# Patient Record
Sex: Female | Born: 1945 | Race: White | Hispanic: No | Marital: Single | Smoking: Never smoker
Health system: Southern US, Community
[De-identification: ages and names within clinical notes are randomized; demographics above are authoritative.]

## PROBLEM LIST (undated history)

## (undated) DIAGNOSIS — J3089 Other allergic rhinitis: Secondary | ICD-10-CM

## (undated) DIAGNOSIS — R413 Other amnesia: Secondary | ICD-10-CM

## (undated) DIAGNOSIS — Z9889 Other specified postprocedural states: Secondary | ICD-10-CM

## (undated) DIAGNOSIS — E1140 Type 2 diabetes mellitus with diabetic neuropathy, unspecified: Secondary | ICD-10-CM

## (undated) DIAGNOSIS — I428 Other cardiomyopathies: Secondary | ICD-10-CM

## (undated) DIAGNOSIS — R112 Nausea with vomiting, unspecified: Secondary | ICD-10-CM

## (undated) DIAGNOSIS — I10 Essential (primary) hypertension: Secondary | ICD-10-CM

## (undated) DIAGNOSIS — E785 Hyperlipidemia, unspecified: Secondary | ICD-10-CM

## (undated) DIAGNOSIS — Z95810 Presence of automatic (implantable) cardiac defibrillator: Secondary | ICD-10-CM

## (undated) DIAGNOSIS — I469 Cardiac arrest, cause unspecified: Secondary | ICD-10-CM

## (undated) DIAGNOSIS — I2510 Atherosclerotic heart disease of native coronary artery without angina pectoris: Secondary | ICD-10-CM

## (undated) DIAGNOSIS — H6980 Other specified disorders of Eustachian tube, unspecified ear: Secondary | ICD-10-CM

## (undated) DIAGNOSIS — J029 Acute pharyngitis, unspecified: Secondary | ICD-10-CM

## (undated) DIAGNOSIS — I4891 Unspecified atrial fibrillation: Secondary | ICD-10-CM

## (undated) DIAGNOSIS — G629 Polyneuropathy, unspecified: Secondary | ICD-10-CM

## (undated) DIAGNOSIS — H6990 Unspecified Eustachian tube disorder, unspecified ear: Secondary | ICD-10-CM

## (undated) HISTORY — PX: COLONOSCOPY: SHX174

## (undated) HISTORY — DX: Other specified disorders of Eustachian tube, unspecified ear: H69.80

## (undated) HISTORY — DX: Presence of automatic (implantable) cardiac defibrillator: Z95.810

## (undated) HISTORY — PX: TONSILLECTOMY: SUR1361

## (undated) HISTORY — DX: Other cardiomyopathies: I42.8

## (undated) HISTORY — PX: KNEE SURGERY: SHX244

## (undated) HISTORY — DX: Acute pharyngitis, unspecified: J02.9

## (undated) HISTORY — DX: Essential (primary) hypertension: I10

## (undated) HISTORY — DX: Unspecified eustachian tube disorder, unspecified ear: H69.90

## (undated) HISTORY — DX: Unspecified atrial fibrillation: I48.91

## (undated) HISTORY — DX: Type 2 diabetes mellitus with diabetic neuropathy, unspecified: E11.40

## (undated) HISTORY — DX: Other allergic rhinitis: J30.89

## (undated) HISTORY — DX: Cardiac arrest, cause unspecified: I46.9

## (undated) HISTORY — DX: Polyneuropathy, unspecified: G62.9

## (undated) HISTORY — DX: Hyperlipidemia, unspecified: E78.5

## (undated) HISTORY — DX: Atherosclerotic heart disease of native coronary artery without angina pectoris: I25.10

## (undated) HISTORY — PX: CATARACT EXTRACTION: SUR2

## (undated) HISTORY — DX: Other amnesia: R41.3

---

## 2001-02-05 ENCOUNTER — Emergency Department (HOSPITAL_COMMUNITY): Admission: EM | Admit: 2001-02-05 | Discharge: 2001-02-06 | Payer: Self-pay | Admitting: *Deleted

## 2001-02-05 ENCOUNTER — Encounter: Payer: Self-pay | Admitting: Emergency Medicine

## 2001-02-14 ENCOUNTER — Ambulatory Visit (HOSPITAL_COMMUNITY): Admission: RE | Admit: 2001-02-14 | Discharge: 2001-02-14 | Payer: Self-pay | Admitting: Internal Medicine

## 2001-02-14 ENCOUNTER — Encounter: Payer: Self-pay | Admitting: Internal Medicine

## 2001-03-27 ENCOUNTER — Other Ambulatory Visit: Admission: RE | Admit: 2001-03-27 | Discharge: 2001-03-27 | Payer: Self-pay | Admitting: Obstetrics & Gynecology

## 2001-10-24 ENCOUNTER — Encounter: Payer: Self-pay | Admitting: Emergency Medicine

## 2001-10-24 ENCOUNTER — Emergency Department (HOSPITAL_COMMUNITY): Admission: EM | Admit: 2001-10-24 | Discharge: 2001-10-24 | Payer: Self-pay | Admitting: Emergency Medicine

## 2004-06-23 ENCOUNTER — Observation Stay (HOSPITAL_COMMUNITY): Admission: AD | Admit: 2004-06-23 | Discharge: 2004-06-24 | Payer: Self-pay | Admitting: Family Medicine

## 2005-04-19 ENCOUNTER — Inpatient Hospital Stay (HOSPITAL_COMMUNITY): Admission: EM | Admit: 2005-04-19 | Discharge: 2005-04-20 | Payer: Self-pay | Admitting: *Deleted

## 2005-05-03 ENCOUNTER — Ambulatory Visit (HOSPITAL_COMMUNITY): Admission: RE | Admit: 2005-05-03 | Discharge: 2005-05-03 | Payer: Self-pay | Admitting: Family Medicine

## 2005-07-31 ENCOUNTER — Ambulatory Visit (HOSPITAL_COMMUNITY): Admission: RE | Admit: 2005-07-31 | Discharge: 2005-07-31 | Payer: Self-pay | Admitting: Family Medicine

## 2005-08-30 ENCOUNTER — Ambulatory Visit (HOSPITAL_COMMUNITY): Admission: RE | Admit: 2005-08-30 | Discharge: 2005-08-30 | Payer: Self-pay | Admitting: Family Medicine

## 2006-01-04 ENCOUNTER — Ambulatory Visit (HOSPITAL_COMMUNITY): Admission: RE | Admit: 2006-01-04 | Discharge: 2006-01-04 | Payer: Self-pay | Admitting: Family Medicine

## 2008-01-17 ENCOUNTER — Ambulatory Visit (HOSPITAL_COMMUNITY): Admission: RE | Admit: 2008-01-17 | Discharge: 2008-01-17 | Payer: Self-pay | Admitting: Family Medicine

## 2009-10-24 ENCOUNTER — Emergency Department: Payer: Self-pay | Admitting: Internal Medicine

## 2009-12-21 ENCOUNTER — Ambulatory Visit: Payer: No Typology Code available for payment source

## 2010-07-24 ENCOUNTER — Ambulatory Visit: Payer: Self-pay | Admitting: Cardiovascular Disease

## 2010-07-24 ENCOUNTER — Inpatient Hospital Stay: Payer: No Typology Code available for payment source | Admitting: Internal Medicine

## 2010-07-29 ENCOUNTER — Encounter: Payer: Self-pay | Admitting: Cardiovascular Disease

## 2010-08-01 ENCOUNTER — Encounter: Payer: Self-pay | Admitting: Cardiovascular Disease

## 2010-08-01 HISTORY — PX: CARDIAC DEFIBRILLATOR PLACEMENT: SHX171

## 2010-08-02 ENCOUNTER — Encounter: Payer: Self-pay | Admitting: Cardiovascular Disease

## 2010-08-06 ENCOUNTER — Emergency Department: Payer: No Typology Code available for payment source | Admitting: Emergency Medicine

## 2010-08-07 ENCOUNTER — Emergency Department: Payer: No Typology Code available for payment source | Admitting: Emergency Medicine

## 2010-08-07 ENCOUNTER — Encounter: Payer: Self-pay | Admitting: Cardiovascular Disease

## 2010-08-09 ENCOUNTER — Encounter: Payer: Self-pay | Admitting: Cardiovascular Disease

## 2010-08-10 ENCOUNTER — Ambulatory Visit: Payer: Self-pay | Admitting: Cardiovascular Disease

## 2010-08-10 ENCOUNTER — Encounter: Payer: Self-pay | Admitting: Cardiovascular Disease

## 2010-08-10 DIAGNOSIS — E785 Hyperlipidemia, unspecified: Secondary | ICD-10-CM

## 2010-08-10 DIAGNOSIS — I469 Cardiac arrest, cause unspecified: Secondary | ICD-10-CM | POA: Insufficient documentation

## 2010-08-10 DIAGNOSIS — I10 Essential (primary) hypertension: Secondary | ICD-10-CM

## 2010-09-24 ENCOUNTER — Encounter: Payer: Self-pay | Admitting: Family Medicine

## 2010-10-04 NOTE — Assessment & Plan Note (Signed)
Summary: F/U [facility] Acute respiratory failure   Visit Type:  Initial Consult Primary Provider:  None  CC:  F/U [facility]; Defib Implant November 28 and 2011.  c/o chest pain on Sunday and was sent to [facility] and told has pneumonia..  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 She was seen by myself at [facility] during this hospital course.  She presents today with her boyfriend. He states that she continues to be weak and is slowly improving. She is recovering from her ICD placement which was performed prior to her discharge. She continues to wear the sling restricting her left arm. Her short term memory continues to have trouble. She denies any shortness of breath. She does have a little chest discomfort when she swallows. No edema. She would like to know when she can go home.   Cardiac cath on 07/29/2010 showed 40% adn 30% lesions in the mid LAD, 30% lesion in a small LCX.  EKG shows NSR with LBBB, rate of 73 bpm  Current Medications (verified): 1)  Aspir-Low 81 Mg Tbec (Aspirin) .... 1 Tablet Daily 2)  Lyrica 75 Mg Caps (Pregabalin) .... 1 Tablet At Bedtime 3)  Meclizine Hcl 25 Mg Tabs (Meclizine Hcl) .... 1 Tablet Every 8 Hours As Needed 4)  Toprol Xl 50 Mg Xr24h-Tab (Metoprolol Succinate) .... One Tablet Once Daily 5)  Lisinopril 20 Mg Tabs (Lisinopril) .... One Tablet Once Daily 6)  Protonix 40 Mg Tbec (Pantoprazole Sodium) .... One Tablet Once Daily 7)  Norvasc 10 Mg Tabs (Amlodipine Besylate) .... One Tablet Once Daily 8)  Hydrochlorothiazide 12.5 Mg Caps (Hydrochlorothiazide) .... One Tablet Once Daily 9)  Tussionex Pennkinetic Er 10-8 Mg/5ml Lqcr  (Hydrocod Polst-Chlorphen Polst) .... Every 12 Hours As Needed For Cough 10)  Oxygen 2 Liters .... As Needed For Pulse Less Than 88%. 11)  Tylenol Arthritis Pain 650 Mg Cr-Tabs (Acetaminophen) .... One Tablet Every 4 Hours As Needed For Pain 12)  Nitrostat 0.4 Mg Subl (Nitroglycerin) .... As Needed 13)  Levaquin 750 Mg Tabs (Levofloxacin) .... One Tablet Once Daily  Allergies (verified): 1)  ! Codeine 2)  ! Lidocaine  Past History:  Social History: Last updated: 08/05/2010 Single-has boyfriend Tobacco Use - No.  Alcohol Use - no Regular Exercise - no Drug Use - no  Risk Factors: Exercise: no (08/05/2010)  Risk Factors: Smoking Status: never (08/05/2010)  Past Medical History: Defib Implant @ [facility] by Dr. Steven Klein August 01, 2010. Hypertension Diabetes Type 2 Diabetic Neuropathy  Past Surgical History: Defib Implant August 01, 2010 @ [facility]. Two knee surgeries Tonsillectomy  Family History: Father: Deceased;  Mother: Deceased; MI age 62  1 sister deceased; cancer 1 brother living; cancer  Review of Systems  The patient denies fever, weight loss, weight gain, vision loss, decreased hearing, hoarseness, chest pain, syncope, dyspnea on exertion, peripheral edema, prolonged cough, abdominal pain, incontinence, muscle weakness, depression, and enlarged lymph nodes.         Short term memory problem  Vital Signs:  Patient profile:   64 year old female Height:      66 inches Weight:      234 pounds BMI:     37 .91 Pulse rate:  73 / minute BP sitting:   94 / 68  (left arm) Cuff size:   large  Vitals Entered By: Lysbeth Galas CMA (August 10, 2010 3:45 PM)  Physical Exam  General:  elderly woman using a wheelchair, left arm in a sling Head:  normocephalic and atraumatic Neck:  Neck supple, no JVD. No masses, thyromegaly or abnormal cervical nodes. Chest Wall:  no deformities or breast masses noted Lungs:  Clear bilaterally to auscultation and  percussion. Heart:  Non-displaced PMI, chest non-tender; regular rate and rhythm, S1, S2 without murmurs, rubs or gallops. Carotid upstroke normal, no bruit.  Pedals normal pulses. No edema, no varicosities. Abdomen:  Bowel sounds positive; abdomen soft and non-tender without masses Msk:  Back normal, normal gait. Muscle strength and tone normal. Pulses:  pulses normal in all 4 extremities Extremities:  No clubbing or cyanosis. Neurologic:  Alert and oriented x 3. Skin:  Intact without lesions or rashes. Psych:  Normal affect.   Impression & Recommendations:  Problem # 1:  CARDIAC ARREST (ICD-427.5) Recovering slowly after her recent cardiac arrest. Very fortunate to have survived with the CPR help of her boyfriend. Will continue her current medication regiment. Normal EF by the cardiac cath report.  The following medications were removed from the medication list:    Lopressor 50 Mg Tabs (Metoprolol tartrate) .Marland Kitchen... 1 tablet two times a day Her updated medication list for this problem includes:    Aspir-low 81 Mg Tbec (Aspirin) .Marland Kitchen... 1 tablet daily    Toprol Xl 50 Mg Xr24h-tab (Metoprolol succinate) ..... One tablet once daily    Lisinopril 20 Mg Tabs (Lisinopril) ..... One tablet once daily    Amlodipine Besylate 5 Mg Tabs (Amlodipine besylate) .Marland Kitchen... Take one tablet by mouth daily    Nitrostat 0.4 Mg Subl (Nitroglycerin) .Marland Kitchen... As needed  Problem # 2:  HYPERLIPIDEMIA-MIXED (ICD-272.4) Mild CAD. On her next visit, we will discuss starting a statin.  Problem # 3:  CAD, NATIVE VESSEL (ICD-414.01) Mild LAD disease noted by cardiac cath. Continue ASA  The following medications were removed from the medication list:    Lopressor 50 Mg Tabs (Metoprolol tartrate) .Marland Kitchen... 1 tablet two times a day Her updated medication list for this problem includes:    Aspir-low 81 Mg Tbec (Aspirin) .Marland Kitchen... 1 tablet daily    Toprol Xl 50 Mg Xr24h-tab (Metoprolol succinate) ..... One tablet once daily     Lisinopril 20 Mg Tabs (Lisinopril) ..... One tablet once daily    Amlodipine Besylate 5 Mg Tabs (Amlodipine besylate) .Marland Kitchen... Take one tablet by mouth daily    Nitrostat 0.4 Mg Subl (Nitroglycerin) .Marland Kitchen... 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 We will have her follow up with Dr. Graciela Husbands on a routine basis for ICD eval.  Patient Instructions: 1)  Your physician recommends that you schedule a follow-up appointment in: 6 months 2)  Your physician has recommended you make the following change in your medication: Decrease Amlodipine to 5mg  once daily.

## 2010-10-06 NOTE — Consult Note (Signed)
[facility]   Imported By: Marylou Mccoy 09/02/2010 16:05:48  _____________________________________________________________________  External Attachment:    Type:   Image     Comment:   External Document

## 2010-10-06 NOTE — Letter (Signed)
Summary[facility]   Imported By: Marylou Mccoy 09/02/2010 15:58:03  _____________________________________________________________________  External Attachment:    Type:   Image     Comment:   External Document

## 2010-10-06 NOTE — Letter (Signed)
Summary: Physicians Telephone Orders   Physicians Telephone Orders   Imported By: Marylou Mccoy 09/01/2010 17:50:18  _____________________________________________________________________  External Attachment:    Type:   Image     Comment:   External Document

## 2010-10-06 NOTE — Letter (Signed)
Summary[facility]   Imported By: Marylou Mccoy 09/02/2010 15:46:49  _____________________________________________________________________  External Attachment:    Type:   Image     Comment:   External Document

## 2010-10-06 NOTE — Letter (Signed)
[facility]   Imported By: Marylou Mccoy 09/02/2010 16:07:18  _____________________________________________________________________  External Attachment:    Type:   Image     Comment:   External Document

## 2010-10-06 NOTE — Op Note (Signed)
[facility]   Imported By: Marylou Mccoy 09/02/2010 16:00:36  _____________________________________________________________________  External Attachment:    Type:   Image     Comment:   External Document

## 2010-10-25 ENCOUNTER — Encounter: Payer: Self-pay | Admitting: Cardiovascular Disease

## 2010-11-01 NOTE — Letter (Signed)
Summary: Medical Record Release  Medical Record Release   Imported By: Harlon Flor 10/28/2010 15:48:48  _____________________________________________________________________  External Attachment:    Type:   Image     Comment:   External Document

## 2010-11-08 ENCOUNTER — Encounter: Payer: Self-pay | Admitting: Internal Medicine

## 2010-11-08 ENCOUNTER — Institutional Professional Consult (permissible substitution) (INDEPENDENT_AMBULATORY_CARE_PROVIDER_SITE_OTHER): Payer: Medicare Other | Admitting: Internal Medicine

## 2010-11-08 DIAGNOSIS — Z95810 Presence of automatic (implantable) cardiac defibrillator: Secondary | ICD-10-CM | POA: Insufficient documentation

## 2010-11-08 DIAGNOSIS — I4901 Ventricular fibrillation: Secondary | ICD-10-CM

## 2010-11-09 ENCOUNTER — Encounter: Payer: Self-pay | Admitting: Internal Medicine

## 2010-11-15 NOTE — Assessment & Plan Note (Signed)
Summary: nep/amd/smw   Visit Type:  Initial Consult Primary Provider:  Dr. Patrecia Pace  CC:  Establish care for Defib check. Denies chest pain or shortness of breath..  History of Present Illness: Lacey Lawrence is seen in followup for an ICD implantation for cardiac arrest. This occurred in November. It occurs in the setting of  normal left ventricular function mild nonobstructive coronary disease and left bundle-branch block.    The patient denies SOB, chest pain, edema or palpitations;  she is anxious to resume to drive. She is 3 months since her event.   Current Medications (verified): 1)  Aspir-Low 81 Mg Tbec (Aspirin) .Marland Kitchen.. 1 Tablet Daily 2)  Metoprolol Succinate 25 Mg Xr24h-Tab (Metoprolol Succinate) .... 1/2 Tablet Once Daily 3)  Tussionex Pennkinetic Er 10-8 Mg/59ml Lqcr (Hydrocod Polst-Chlorphen Polst) .... Every 12 Hours As Needed For Cough 4)  Tylenol Arthritis Pain 650 Mg Cr-Tabs (Acetaminophen) .... One Tablet Every 4 Hours As Needed For Pain 5)  Nitrostat 0.4 Mg Subl (Nitroglycerin) .... As Needed 6)  Gabapentin 300 Mg Caps (Gabapentin) .... One Tablet Two Times A Day 7)  Crestor 10 Mg Tabs (Rosuvastatin Calcium) .... One Tablet At Bedtime  Allergies (verified): 1)  ! Codeine 2)  ! Lidocaine  Past History:  Past Medical History: Last updated: August 15, 2010 Defib Implant @ [facility] by Dr. Sherryl Manges August 01, 2010. Hypertension Diabetes Type 2 Diabetic Neuropathy  Past Surgical History: Last updated: 08/15/2010 Defib Implant August 01, 2010 @ [facility]. Two knee surgeries Tonsillectomy  Family History: Last updated: 2010/08/15 Father: Deceased;  Mother: Deceased; MI age 21  1 sister deceased; cancer 1 brother living; cancer  Social History: Last updated: 08/05/2010 Single-has boyfriend Tobacco Use - No.  Alcohol Use - no Regular Exercise - no Drug Use - no  Risk Factors: Exercise: no (08/05/2010)  Risk Factors: Smoking Status: never  (08/05/2010)  Vital Signs:  Patient profile:   65 year old female Height:      66 inches Weight:      234 pounds BMI:     37.91 Pulse rate:   64 / minute BP sitting:   130 / 88  (left arm) Cuff size:   large  Vitals Entered By: Bishop Dublin, CMA (November 08, 2010 10:47 AM)  Physical Exam  General:  The patient was alert and oriented in no acute distress. HEENT Normal.  Neck veins were flat, carotids were brisk.  Lungs were clear.  Heart sounds were regular without murmurs or gallops.  Abdomen was soft with active bowel sounds. There is no clubbing cyanosis or edema. Skin Warm and dry device pocket is well-healed   Impression & Recommendations:  Problem # 1:  CARDIAC ARREST (ICD-427.5) The patient has had no recurrent ventricular arrhythmias. He requests permission to drive. This is restricted by the state until 6 months after her event which is at the end of May. This has been repeated to her number of times today she expressed understanding.  Problem # 2:  IMPLANTATION OF DEFIBRILLATOR, HX OF (ICD-V45.02) Device parameters and data were reviewed and changes were made to maximize longevity  Problem # 3:  HYPERTENSION, BENIGN (ICD-401.1) adequately controlled at this time. The following medications were removed from the medication list:    Lisinopril 20 Mg Tabs (Lisinopril) ..... One tablet once daily    Amlodipine Besylate 5 Mg Tabs (Amlodipine besylate) .Marland Kitchen... Take one tablet by mouth daily    Hydrochlorothiazide 12.5 Mg Caps (Hydrochlorothiazide) ..... One tablet once daily Her updated  medication list for this problem includes:    Aspir-low 81 Mg Tbec (Aspirin) .Marland Kitchen... 1 tablet daily    Metoprolol Succinate 25 Mg Xr24h-tab (Metoprolol succinate) .Marland Kitchen... 1/2 tablet once daily

## 2010-11-15 NOTE — Letter (Signed)
Summary: POA  POA   Imported By: Harlon Flor 11/09/2010 12:55:35  _____________________________________________________________________  External Attachment:    Type:   Image     Comment:   External Document

## 2011-01-20 NOTE — Discharge Summary (Signed)
NAME:  Mcglaun, Kimberl            ACCOUNT NO.:  [id number]   MEDICAL RECORD NO.:  [id number]          PATIENT TYPE:  INP   LOCATION:  IC03                          FACILITY:  [facility]   PHYSICIAN:  Patrica Duel, M.D.    DATE OF BIRTH:  04-12-1946   DATE OF ADMISSION:  04/19/2005  DATE OF DISCHARGE:  08/17/2006LH                                 DISCHARGE SUMMARY   DISCHARGE DIAGNOSES:  1.  Chest pain, questionable etiology.  2.  Atrial fibrillation, paroxysmal, spontaneous conversion.      Anticoagulation begun.  3.  History of hypertension.  4.  History of severe osteoarthritis, status post arthroscopic procedures on      her knees.   For details regarding admission, please refer to the admitting note.  Briefly, this 65 year old female with the above history presented to the  emergency department with the sudden onset of choking sensation and pain  in the substernal region.  Her blood pressure was also noted to be elevated  on her home machine.  Her pain did not resolve, and she presented to the  emergency department for evaluation.  She was found to be in atrial  fibrillation and had a left bundle branch block on EKG.  She was  administered nitroglycerin, morphine, as well as heparin.  Her pain did  resolve at that time.  Her atrial fibrillation spontaneously converted to a  normal sinus rhythm.  Cardiac markers remained negative in the ER.  She was  admitted with chest pain, atrial fibrillation, and question of acute  coronary syndrome.   HOSPITAL COURSE:  Patient was admitted to the ICU, where she remained in  sinus rhythm and comfortable.  She was seen by Dr. Domingo Sep.  Cardiac  markers remained negative.  Outpatient workup was arranged.  Heparin was  discontinued, and Coumadin 5 mg begun.  She was stable for discharge at that  time.   DISPOSITION:  Medications include Lopressor 25 b.i.d., Norvasc 5 daily,  Coumadin 5 daily.  She will be followed and treated expectedly as an  outpatient.  Pro times per cardiology.      Patrica Duel, M.D.  Electronically Signed     MC/MEDQ  D:  05/13/2005  T:  05/13/2005  Job:  [id number]

## 2011-01-20 NOTE — Procedures (Signed)
NAME:  Lacey Lawrence, Lacey Lawrence            ACCOUNT NO.:  [id number]   MEDICAL RECORD NO.:  [id number]          PATIENT TYPE:  INP   LOCATION:  IC03                          FACILITY:  [facility]   PHYSICIAN:  Dani Gobble, MD       DATE OF BIRTH:  [birth year]   DATE OF PROCEDURE:  04/19/2005  DATE OF DISCHARGE:                                  ECHOCARDIOGRAM   REFERRING PHYSICIAN:  Patrica Duel, M.D.   INDICATIONS:  [name] is a 65 year old female with a past medical  history of hypertension who was admitted for atrial fibrillation associated  with chest discomfort and is referred for echocardiogram today.   The technical quality of the study is somewhat limited secondary to patient  body habitus and poor acoustic windows.   The aorta is within normal limits measured at 3.2 cm.   The left atrium is within normal limits measured at 3.9 cm.  No obvious  clots or masses were appreciated and the patient appeared to be in a sinus  rhythm at this point in time.   The interventricular septum and posterior wall are mildly concentrically  thickened.   The aortic valve is not well-visualized.  Doppler interrogation of the  aortic valve reveals slightly elevated velocities.  The apical view only  visualized with the leaflets to a limited degree and there does appear to be  leaflet mobility.  It does appear to be mildly thickened. No significant  aortic insufficiency is noted.   The mitral valve appears grossly structurally normal.  Trace to mild mitral  regurgitation is noted.   Pulmonic valve is not visualized.   Tricuspid valve is also poorly visualized.  Trace to mild tricuspid  regurgitation is noted.   The left ventricle is normal in size with LVIDD measured at 4.7 cm.  LVISD  measured at 2.9 cm.  Overall, left ventricle systolic function appears  normal.   Due to the technical limitations, subtle regional wall motion abnormalities  cannot be excluded, but no gross regional wall  motion abnormalities are  noted on this study.   The right atrium and right ventricle appeared grossly normal in size with  preserved left systolic function.   IMPRESSION:  1.  Mild concentric left ventricular hypertrophy.  2.  Poorly visualized aortic valve with mildly elevated velocities, but      there does appear to be leaflet mobility from      the apical views only.  3.  Trace to mild mitral and tricuspid regurgitation.  4.  Normal left ventricular size and systolic function without gross      regional wall motion abnormality noted.           ______________________________  Dani Gobble, MD     AB/MEDQ  D:  04/19/2005  T:  04/20/2005  Job:  [id number]

## 2011-01-20 NOTE — H&P (Signed)
NAME:  Lacey Lawrence, Lacey Lawrence NO.:  [id number]   MEDICAL RECORD NO.:  [id number]           PATIENT TYPE:   LOCATION:  A311                          FACILITY:  [facility]   PHYSICIAN:  Kirk Ruths, M.D.    DATE OF BIRTH:   DATE OF ADMISSION:  06/23/2004  DATE OF DISCHARGE:  LH                                HISTORY & PHYSICAL   CHIEF COMPLAINT:  Back pain.   PRESENTING ILLNESS:  This is a 65 year old female who presented to the  office for the first time the day before admission with severe back pain.  At that point the patient was lying over the table unable to sit or stand  upright.  The lumbar spine was exquisitely tender.  She at that time was  given an injection of Demerol with Phenergan.  She received a prescription  for [facility] and a prednisone dosepak and 10 mg Valiums with the hope  that her pain would subside enough that we could get an MRI on her.  The  patient returns today stating her pain is no better, so she will be admitted  for pain control and further workup.   PAST MEDICAL HISTORY:  She is allergic to CODEINE, and it makes her  nauseated.   She currently takes no medications regularly.   She is status post cataract surgery and arthroscopic on both knees.   REVIEW OF SYSTEMS:  Denies chest pain, nausea, vomiting, diarrhea, or  numbness in her lower extremities.   SOCIAL HISTORY:  She denies smoking, alcohol, or substance abuse.  She sells  insurance.   PHYSICAL EXAMINATION:  GENERAL:  An elderly, morbidly obese female who  appears miserable.  VITAL SIGNS:  She is afebrile, pulse 76 and regular, respirations 16.  Blood  pressure is 146/92.  HEENT:  TMs normal, pupils equal and react to light and accommodation.  Oropharynx benign.  NECK:  Supple without JVD, bruit, or thyromegaly.  CHEST:  Lungs are clear in all areas.  CARDIAC:  Regular sinus rhythm without murmur, gallop, or rub.  ABDOMEN:  Soft, pendulous, nontender.  BACK:  There  is exquisite tenderness in her LS spine, left greater than  right.  Straight leg raising positive on the left.  NEUROLOGIC:  Grossly intact.   ASSESSMENT:  Intractable back pain.     Will  WMM/MEDQ  D:  06/23/2004  T:  06/23/2004  Job:  [id number]

## 2011-01-20 NOTE — H&P (Signed)
NAME:  Lacey Lawrence, Lacey Lawrence            ACCOUNT NO.:  [id number]   MEDICAL RECORD NO.:  [id number]          PATIENT TYPE:  INP   LOCATION:  IC03                          FACILITY:  [facility]   PHYSICIAN:  Patrica Duel, M.D.    DATE OF BIRTH:  1946-04-17   DATE OF ADMISSION:  04/19/2005  DATE OF DISCHARGE:  LH                                HISTORY & PHYSICAL   CHIEF COMPLAINT:  Chest pain.   HISTORY OF PRESENT ILLNESS:  This is a very pleasant, 65 year old female  with a history of diffuse osteoarthritis, particularly of low back and  knees.  She is currently on no medications for this except for p.r.n.  Excedrin.  She has a history of cardiac catheterization in the remote past  done in [place].  She states she has a left bundle branch block on her EKG  when questioned.  Other history is unavailable.   The patient was at her computer when she experienced a sudden onset of a  choking sensation and pain in the substernal region of her chest.  There  was no radiation, palpitations, diaphoresis or syncope noted.  Her blood  pressure was quite high on her home machine.  It did not resolve and she  came to the emergency department for evaluation.  She was found to be in  atrial fibrillation and have left bundle block on her EKG.  She was  administered nitroglycerin, morphine as well as heparin.  Her pain has  resolved.  Her atrial fibrillation spontaneously converted to sinus rhythm.   Cardiac markers remained negative in the emergency department.  The patient  is admitted with chest pain and atrial fibrillation, question acute coronary  syndrome.   There is no history of headache, neurologic deficits, nausea, vomiting,  diarrhea, melena, hematemesis, hematochezia or genitourinary symptoms.   CURRENT MEDICATIONS:  None, except as noted.   PAST SURGICAL HISTORY:  Status post arthroscopic surgeries on her knees.   ALLERGIES:  CODEINE.   SOCIAL HISTORY:  Nonsmoker, nondrinker.  Very  active.   REVIEW OF SYSTEMS:  Negative except as mentioned.   FAMILY HISTORY:  Noncontributory.   PHYSICAL EXAMINATION:  GENERAL:  A very pleasant, fully alert female in no  acute distress.  VITAL SIGNS:  Blood pressure 162/105, pulse 123 in atrial fibrillation,  currently BP 140/70, heart rate 65 and regular, afebrile, respirations 20  and unlabored.  O2 saturations 97%.  HEENT:  Normocephalic, atraumatic.  Pupils are equal.  Ears, nose and throat  are benign.  NECK:  Supple.  There is a murmur bilaterally.  There are no masses,  thyromegaly noted.  LUNGS:  Clear.  HEART:  Heart sounds are distant.  There is a soft systolic murmur without  gallops or rubs.  Best heard at the apex and upper right second intercostal  space.  ABDOMEN:  Nontender, nondistended.  Bowel sounds intact.  EXTREMITIES:  No clubbing, cyanosis or edema.  NEUROLOGIC:  Nonfocal.   LABORATORY DATA AND X-RAY FINDINGS:  EKG this morning shows normal sinus  rhythm with a left bundle branch block.   ASSESSMENT:  1.  Chest pain.  Left bundle branch block and atrial fibrillation have      spontaneously converted.  2.  Acute coronary syndrome.   PLAN:  Prompt cardiology consult and probable cardiac catheterization given  the array of her symptoms.  Will check D-dimer, PFTs, etc.  Continue  heparin, nitroglycerin drip pending cardiology consultation.  Will follow  and treat expectantly.      Patrica Duel, M.D.  Electronically Signed     MC/MEDQ  D:  04/19/2005  T:  04/19/2005  Job:  [id number]

## 2011-02-02 ENCOUNTER — Ambulatory Visit (INDEPENDENT_AMBULATORY_CARE_PROVIDER_SITE_OTHER): Payer: Medicare Other | Admitting: Cardiovascular Disease

## 2011-02-02 ENCOUNTER — Encounter: Payer: Self-pay | Admitting: Cardiovascular Disease

## 2011-02-02 DIAGNOSIS — I10 Essential (primary) hypertension: Secondary | ICD-10-CM

## 2011-02-02 DIAGNOSIS — I469 Cardiac arrest, cause unspecified: Secondary | ICD-10-CM

## 2011-02-02 DIAGNOSIS — Z95810 Presence of automatic (implantable) cardiac defibrillator: Secondary | ICD-10-CM

## 2011-02-02 DIAGNOSIS — E785 Hyperlipidemia, unspecified: Secondary | ICD-10-CM

## 2011-02-02 MED ORDER — HYDROCHLOROTHIAZIDE 25 MG PO TABS: 25.0000 mg | ORAL_TABLET | Freq: Every day | ORAL | Status: DC

## 2011-02-02 NOTE — Assessment & Plan Note (Signed)
She should be scheduled for ICD check in the next several weeks.

## 2011-02-02 NOTE — Assessment & Plan Note (Signed)
No further episodes of arrhythmia that she can determine. Last ICD check did not reveal arrhythmia. She feels well.

## 2011-02-02 NOTE — Assessment & Plan Note (Signed)
Blood pressure is borderline elevated. We will start HCTZ 12.5 mg daily, titrating up to 25 mg daily if needed. She does have some lower extremity edema

## 2011-02-02 NOTE — Assessment & Plan Note (Signed)
Cholesterol is at goal on the current lipid regimen. No changes to the medications were made.  

## 2011-02-02 NOTE — Progress Notes (Signed)
   Patient ID: Lacey Lawrence, female    DOB: 09-Sep-1945, 65 y.o.   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  She reports that she is doing well. She denies any shortness of breath, chest discomfort. She does have occasional lower extremity edema. Overall she feels well. No palpitations or other symptoms concerning for arrhythmia. She reports that her blood pressure has been mildly elevated at home.   Cardiac cath on 07/29/2010 showed 40% adn 30% lesions in the mid LAD, 30% lesion in a small LCX.   EKG shows NSR with LBBB, rate of 60 bpm      Review of Systems  Constitutional: Negative.   HENT: Negative.   Eyes: Negative.   Respiratory: Negative.   Cardiovascular: Positive for leg swelling.  Gastrointestinal: Negative.   Musculoskeletal: Negative.   Skin: Negative.   Neurological: Negative.   Hematological: Negative.   Psychiatric/Behavioral: Negative.   All other systems reviewed and are negative.      Physical Exam  Nursing note and vitals reviewed. Constitutional: She is oriented to person, place, and time. She appears well-developed and well-nourished.       Obese  HENT:  Head: Normocephalic.  Nose: Nose normal.  Mouth/Throat: Oropharynx is clear and moist.  Eyes: Conjunctivae are normal. Pupils are equal, round, and reactive to light.  Neck: Normal range of motion. Neck supple. No JVD present.  Cardiovascular: Normal rate, regular rhythm, S1 normal, S2 normal, normal heart sounds and intact distal pulses.  Exam reveals no gallop and no friction rub.   No murmur heard. Pulmonary/Chest: Effort normal and  breath sounds normal. No respiratory distress. She has no wheezes. She has no rales. She exhibits no tenderness.  Abdominal: Soft. Bowel sounds are normal. She exhibits no distension. There is no tenderness.  Musculoskeletal: Normal range of motion. She exhibits no edema and no tenderness.  Lymphadenopathy:    She has no cervical adenopathy.  Neurological: She is alert and oriented to person, place, and time. Coordination normal.  Skin: Skin is warm and dry. No rash noted. No erythema.  Psychiatric: She has a normal mood and affect. Her behavior is normal. Judgment and thought content normal.         Assessment and Plan

## 2011-02-02 NOTE — Patient Instructions (Signed)
You are doing well. Please start HCTZ 1/2 tab daily for blood pressure. Take a full pill if Blood pressure stays elevated. Please call us if you have new issues that need to be addressed before your next appt.  We will call you for a follow up Appt. In 6 months

## 2011-02-09 ENCOUNTER — Encounter: Payer: No Typology Code available for payment source | Admitting: *Deleted

## 2011-02-14 ENCOUNTER — Encounter: Payer: Self-pay | Admitting: *Deleted

## 2011-02-16 ENCOUNTER — Other Ambulatory Visit: Payer: Self-pay

## 2011-02-16 ENCOUNTER — Ambulatory Visit (INDEPENDENT_AMBULATORY_CARE_PROVIDER_SITE_OTHER): Payer: Medicare Other | Admitting: *Deleted

## 2011-02-16 DIAGNOSIS — Z95810 Presence of automatic (implantable) cardiac defibrillator: Secondary | ICD-10-CM

## 2011-02-16 DIAGNOSIS — I469 Cardiac arrest, cause unspecified: Secondary | ICD-10-CM

## 2011-02-16 DIAGNOSIS — I509 Heart failure, unspecified: Secondary | ICD-10-CM

## 2011-02-20 NOTE — Progress Notes (Signed)
icd remote check w/icm  

## 2011-03-15 ENCOUNTER — Encounter: Payer: Self-pay | Admitting: *Deleted

## 2011-07-04 ENCOUNTER — Encounter: Payer: Self-pay | Admitting: Cardiovascular Disease

## 2011-07-07 ENCOUNTER — Ambulatory Visit: Payer: Medicare Other | Admitting: Cardiovascular Disease

## 2011-07-07 ENCOUNTER — Encounter: Payer: Medicare Other | Admitting: Internal Medicine

## 2011-07-14 IMAGING — CR DG CHEST 1V PORT
1 series · 1 of 1 positions shown · non-contrast
Comparison: none

REASON FOR EXAM: right posterior chest pain
COMMENTS:

[view not recorded]
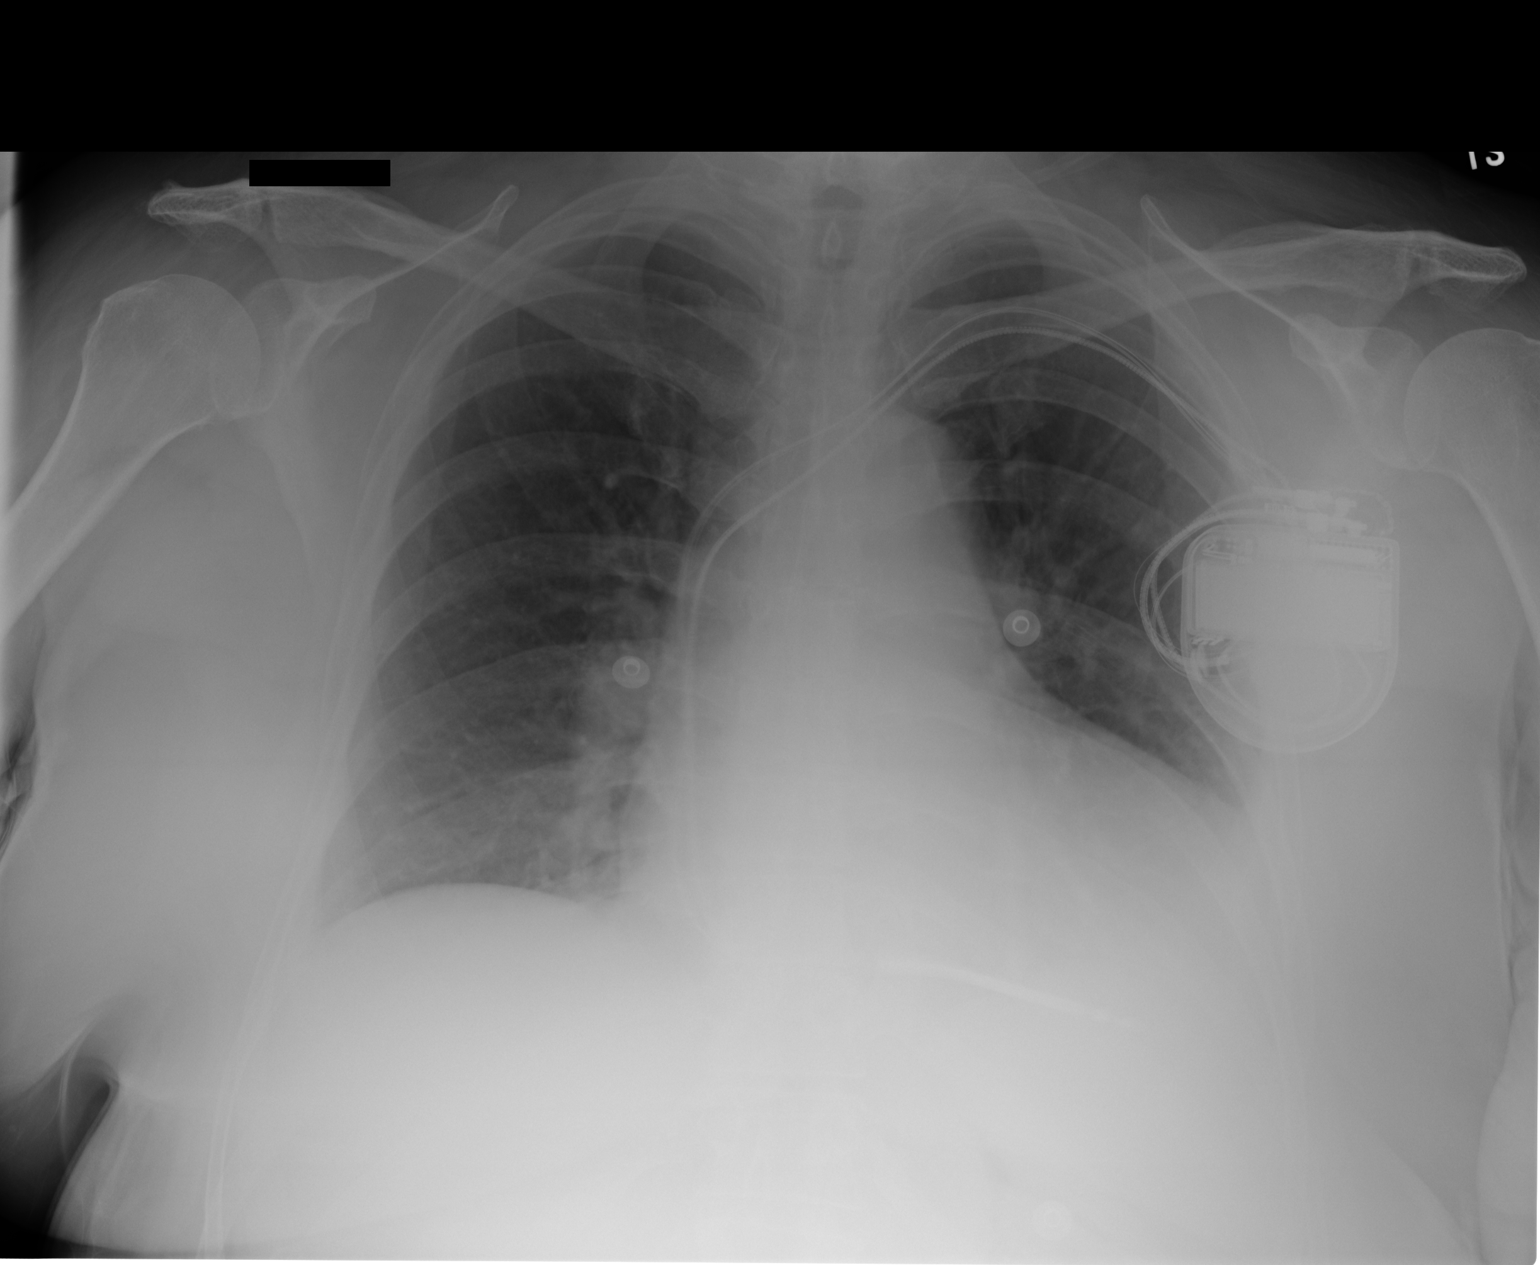

[1 of 1 positions shown; findings below may reference images not displayed]

PROCEDURE:     DXR - DXR PORTABLE CHEST SINGLE VIEW  - August 06, 2010 [DATE]

RESULT:     Comparison is made to prior study dated 08/02/2010. The patient
has taken a shallow inspiration. With technique taken into consideration no
focal regions of consolidation or focal infiltrates are appreciated. The
cardiac silhouette is moderately enlarged. The visualized bony skeleton is
unremarkable. A left-sided pectoralis pacing unit is appreciated with lead
tips projecting in the region of the right atrium and right ventricle.
IMPRESSION: 1. Shallow inspiration without evidence of acute cardiopulmonary disease.

## 2011-07-24 ENCOUNTER — Encounter: Payer: Medicare Other | Admitting: Internal Medicine

## 2011-08-01 ENCOUNTER — Ambulatory Visit: Payer: Medicare Other | Admitting: Cardiovascular Disease

## 2011-08-01 ENCOUNTER — Encounter: Payer: Medicare Other | Admitting: Internal Medicine

## 2011-09-14 ENCOUNTER — Ambulatory Visit (INDEPENDENT_AMBULATORY_CARE_PROVIDER_SITE_OTHER): Payer: Medicare Other | Admitting: Cardiovascular Disease

## 2011-09-14 ENCOUNTER — Ambulatory Visit (INDEPENDENT_AMBULATORY_CARE_PROVIDER_SITE_OTHER): Payer: Medicare Other | Admitting: Internal Medicine

## 2011-09-14 ENCOUNTER — Encounter: Payer: Self-pay | Admitting: Internal Medicine

## 2011-09-14 ENCOUNTER — Encounter: Payer: Self-pay | Admitting: Cardiovascular Disease

## 2011-09-14 DIAGNOSIS — I469 Cardiac arrest, cause unspecified: Secondary | ICD-10-CM

## 2011-09-14 DIAGNOSIS — I428 Other cardiomyopathies: Secondary | ICD-10-CM

## 2011-09-14 DIAGNOSIS — Z95810 Presence of automatic (implantable) cardiac defibrillator: Secondary | ICD-10-CM

## 2011-09-14 DIAGNOSIS — I2510 Atherosclerotic heart disease of native coronary artery without angina pectoris: Secondary | ICD-10-CM

## 2011-09-14 DIAGNOSIS — I4891 Unspecified atrial fibrillation: Secondary | ICD-10-CM

## 2011-09-14 DIAGNOSIS — E785 Hyperlipidemia, unspecified: Secondary | ICD-10-CM

## 2011-09-14 DIAGNOSIS — I10 Essential (primary) hypertension: Secondary | ICD-10-CM

## 2011-09-14 LAB — ICD DEVICE OBSERVATION
AL AMPLITUDE: 2.1 mv
AL IMPEDENCE ICD: 589 Ohm
BAMS-0001: 170 {beats}/min
BATTERY VOLTAGE: 3.16 V
HV IMPEDENCE: 80 Ohm
RV LEAD IMPEDENCE ICD: 779 Ohm
RV LEAD THRESHOLD: 1 V
TZAT-0002ATACH: NEGATIVE
TZAT-0002ATACH: NEGATIVE
TZAT-0004SLOWVT: 8
TZAT-0004SLOWVT: 8
TZAT-0011SLOWVT: 10 ms
TZAT-0011SLOWVT: 10 ms
TZAT-0012ATACH: 150 ms
TZAT-0012ATACH: 150 ms
TZAT-0012FASTVT: 200 ms
TZAT-0012SLOWVT: 200 ms
TZAT-0012SLOWVT: 200 ms
TZAT-0019ATACH: 6 V
TZAT-0019SLOWVT: 8 V
TZAT-0020ATACH: 1.5 ms
TZAT-0020ATACH: 1.5 ms
TZAT-0020ATACH: 1.5 ms
TZAT-0020FASTVT: 1.5 ms
TZON-0003ATACH: 350 ms
TZON-0003SLOWVT: 340 ms
TZON-0003VSLOWVT: 450 ms
TZST-0001ATACH: 4
TZST-0001FASTVT: 2
TZST-0001SLOWVT: 4
TZST-0002ATACH: NEGATIVE
TZST-0002FASTVT: NEGATIVE
TZST-0002FASTVT: NEGATIVE
TZST-0002FASTVT: NEGATIVE
TZST-0003SLOWVT: 25 J
TZST-0003SLOWVT: 35 J

## 2011-09-14 MED ORDER — LISINOPRIL 10 MG PO TABS: 10.0000 mg | ORAL_TABLET | Freq: Every day | ORAL | Status: DC

## 2011-09-14 NOTE — Assessment & Plan Note (Signed)
Currently with no symptoms of angina. No further workup at this time. Continue current medication regimen. 

## 2011-09-14 NOTE — Assessment & Plan Note (Signed)
The patient's device was interrogated.  The information was reviewed. No changes were made in the programming.    

## 2011-09-14 NOTE — Assessment & Plan Note (Signed)
The patient has nonsustained paroxysmal  atrial fibrillation. She has multiple thromboembolic risk factors including diabetes hypertension age and gender. The question is how much atrial fibrillation is sufficient to justify the initiation of oral anticoagulation. I don't think we know the answer to that. I'm not sure that 5 hours is the answer to await to see if she has more. I will probably use a threshold of 12-24 hours

## 2011-09-14 NOTE — Progress Notes (Signed)
   Patient ID: Lacey Lawrence, female    DOB: 1946/01/12, 66 y.o.   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  She reports that she is doing well. She denies any shortness of breath, chest discomfort. Overall she feels well. No palpitations or other symptoms concerning for arrhythmia. Her medication list has changed from her last visit. She does not think that she is taking amlodipine or lisinopril. She does not have her medication list with her. No new complaints.   Cardiac cath on 07/29/2010 showed 40% adn 30% lesions in the mid LAD, 30% lesion in a small LCX.   EKG shows NSR with LBBB, rate of 61 bpm, A. Sensed       Review of Systems  Constitutional: Negative.   HENT: Negative.   Eyes: Negative.   Respiratory: Negative.   Gastrointestinal: Negative.   Musculoskeletal: Negative.   Skin: Negative.   Neurological: Negative.   Hematological: Negative.   Psychiatric/Behavioral: Negative.   All other systems reviewed and are negative.      Physical Exam  Nursing note and vitals reviewed. Constitutional: She is oriented to person, place, and time. She appears well-developed and well-nourished.       Obese  HENT:  Head: Normocephalic.  Nose: Nose normal.  Mouth/Throat: Oropharynx is clear and moist.  Eyes: Conjunctivae are normal. Pupils are equal, round, and reactive to light.  Neck: Normal range of motion. Neck supple. No JVD present.  Cardiovascular: Normal rate, regular rhythm, S1 normal, S2 normal and intact distal pulses.  Exam reveals no gallop and no friction rub.   Murmur heard.  Crescendo systolic murmur  is present with a grade of 2/6  Pulmonary/Chest: Effort normal and breath sounds normal. No respiratory distress. She has no wheezes. She has no rales. She exhibits no tenderness.  Abdominal: Soft. Bowel sounds are normal. She exhibits no distension. There is no tenderness.  Musculoskeletal: Normal range of motion. She exhibits no edema and no tenderness.  Lymphadenopathy:    She has no cervical adenopathy.  Neurological: She is alert and oriented to person, place, and time. Coordination normal.  Skin: Skin is warm and dry. No rash noted. No erythema.  Psychiatric: She has a normal mood and affect. Her behavior is normal. Judgment and thought content normal.         Assessment and Plan

## 2011-09-14 NOTE — Patient Instructions (Signed)
You will Lacey Lawrence for device check in 3 months.

## 2011-09-14 NOTE — Assessment & Plan Note (Signed)
She has follow up with Dr. Graciela Husbands today.  She denies any events.

## 2011-09-14 NOTE — Assessment & Plan Note (Signed)
Continue Lipitor at its current dose

## 2011-09-14 NOTE — Assessment & Plan Note (Addendum)
Blood pressure borderline elevated today. She was previously on lisinopril though she does not believe she is taking this. We will restart lisinopril 10 mg daily.

## 2011-09-14 NOTE — Progress Notes (Signed)
  HPI  Lacey Lawrence is a 66 y.o. female  seen in followup for an ICD implantation for cardiac arrest. This occurred in November. It occurs in the setting of normal left ventricular function mild nonobstructive coronary disease and left bundle-branch block.   The patient denies chest pain, shortness of breath, nocturnal dyspnea, orthopnea or peripheral edema.  There have been no palpitations, lightheadedness or syncope.   Thromboembolic risk factors are notable for hypertension age and gender  Past Medical History  Diagnosis Date  . Hypertension   . Diabetes mellitus   . Diabetic neuropathy   . Other primary cardiomyopathies   . Atrial fibrillation-paroxysmal     up to  5 hour episodes detected on her ICD  . Cardiac arrest -aborted   . Dual ICD-[employer]     Past Surgical History  Procedure Date  . Cardiac defibrillator placement 08/01/2010    [facility], Dr. Graciela Husbands  . Knee surgery   . Tonsillectomy     Current Outpatient Prescriptions  Medication Sig Dispense Refill  . aspirin 81 MG EC tablet Take 81 mg by mouth daily.        Marland Kitchen atorvastatin (LIPITOR) 40 MG tablet Take 40 mg by mouth daily.        Marland Kitchen gabapentin (NEURONTIN) 300 MG capsule Take 300 mg by mouth 2 (two) times daily.        . hydrochlorothiazide 25 MG tablet Take 1 tablet (25 mg total) by mouth daily.  30 tablet  6  . lisinopril (PRINIVIL,ZESTRIL) 10 MG tablet Take 1 tablet (10 mg total) by mouth daily.  90 tablet  3  . metoprolol succinate (TOPROL-XL) 25 MG 24 hr tablet Take 25 mg by mouth daily.       . nitroGLYCERIN (NITROSTAT) 0.4 MG SL tablet Place 0.4 mg under the tongue every 5 (five) minutes as needed.          Allergies  Allergen Reactions  . Codeine   . Lidocaine     Review of Systems negative except from HPI and PMH  Physical Exam BP 138/82  Pulse 61  Ht 5\' 5"  (1.651 m)  Wt 257 lb (116.574 kg)  BMI 42.77 kg/m2 Well developed and well nourished in no acute distress HENT normal E scleral and  icterus clear Neck Supple JVP flat; carotids brisk and full Clear to ausculation egular rate and rhythm, no murmurs gallops or rub Soft with active bowel sounds No clubbing cyanosis Trace Edema Alert and oriented, grossly normal motor and sensory function Skin Warm and Dry   Assessment and  Plan

## 2011-09-14 NOTE — Patient Instructions (Signed)
You are doing well. Please start lisinopril 10 mg daily  Please call us if you have new issues that need to be addressed before your next appt.  Your physician wants you to follow-up in: 6 months.  You will receive a reminder letter in the mail two months in advance. If you don't receive a letter, please call our office to schedule the follow-up appointment.

## 2011-09-14 NOTE — Assessment & Plan Note (Signed)
No intercurrent sustained ventricular arrhythmias. 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

## 2011-10-08 ENCOUNTER — Emergency Department: Payer: Self-pay | Admitting: Emergency Medicine

## 2011-10-09 ENCOUNTER — Ambulatory Visit (INDEPENDENT_AMBULATORY_CARE_PROVIDER_SITE_OTHER): Payer: Medicare Other | Admitting: *Deleted

## 2011-10-09 ENCOUNTER — Encounter: Payer: Medicare Other | Admitting: *Deleted

## 2011-10-09 DIAGNOSIS — I469 Cardiac arrest, cause unspecified: Secondary | ICD-10-CM

## 2011-10-09 LAB — ICD DEVICE OBSERVATION
ATRIAL PACING ICD: 10.19 pct
BAMS-0001: 170 {beats}/min
DEV-0020ICD: NEGATIVE
FVT: 0
PACEART VT: 0
RV LEAD IMPEDENCE ICD: 798 Ohm
RV LEAD THRESHOLD: 0.75 V
TOT-0001: 1
TZAT-0001ATACH: 1
TZAT-0001FASTVT: 1
TZAT-0002ATACH: NEGATIVE
TZAT-0004SLOWVT: 8
TZAT-0004SLOWVT: 8
TZAT-0011SLOWVT: 10 ms
TZAT-0011SLOWVT: 10 ms
TZAT-0012ATACH: 150 ms
TZAT-0012ATACH: 150 ms
TZAT-0012ATACH: 150 ms
TZAT-0012SLOWVT: 200 ms
TZAT-0012SLOWVT: 200 ms
TZAT-0013SLOWVT: 1
TZAT-0013SLOWVT: 2
TZAT-0018ATACH: NEGATIVE
TZAT-0018ATACH: NEGATIVE
TZAT-0020ATACH: 1.5 ms
TZAT-0020ATACH: 1.5 ms
TZAT-0020ATACH: 1.5 ms
TZAT-0020FASTVT: 1.5 ms
TZAT-0020SLOWVT: 1.5 ms
TZON-0003ATACH: 350 ms
TZON-0003SLOWVT: 340 ms
TZON-0003VSLOWVT: 450 ms
TZON-0004SLOWVT: 28
TZST-0001ATACH: 4
TZST-0001FASTVT: 2
TZST-0001FASTVT: 4
TZST-0001FASTVT: 6
TZST-0001SLOWVT: 4
TZST-0001SLOWVT: 6
TZST-0002ATACH: NEGATIVE
TZST-0002ATACH: NEGATIVE
TZST-0002FASTVT: NEGATIVE
TZST-0002FASTVT: NEGATIVE
TZST-0002FASTVT: NEGATIVE
TZST-0003SLOWVT: 25 J
TZST-0003SLOWVT: 35 J
TZST-0003SLOWVT: 35 J
VENTRICULAR PACING ICD: 0 pct
VF: 0

## 2011-10-09 NOTE — Progress Notes (Signed)
ICD check 

## 2011-10-11 ENCOUNTER — Encounter: Payer: Self-pay | Admitting: Internal Medicine

## 2011-10-11 ENCOUNTER — Ambulatory Visit (INDEPENDENT_AMBULATORY_CARE_PROVIDER_SITE_OTHER): Payer: Medicare Other | Admitting: Internal Medicine

## 2011-10-11 DIAGNOSIS — I469 Cardiac arrest, cause unspecified: Secondary | ICD-10-CM

## 2011-10-11 DIAGNOSIS — T82198A Other mechanical complication of other cardiac electronic device, initial encounter: Secondary | ICD-10-CM | POA: Insufficient documentation

## 2011-10-11 DIAGNOSIS — Z95810 Presence of automatic (implantable) cardiac defibrillator: Secondary | ICD-10-CM

## 2011-10-11 NOTE — Assessment & Plan Note (Signed)
No intercurrent Ventricular tachycardia  

## 2011-10-11 NOTE — Progress Notes (Signed)
  HPI  Lacey Lawrence is a 66 y.o. female  seen in followup for an ICD implantation for cardiac arrest. This occurred in November. It occurs in the setting of normal left ventricular function mild nonobstructive coronary disease and left bundle-branch block.   The patient denies chest pain, shortness of breath, nocturnal dyspnea, orthopnea or peripheral edema.  There have been no palpitations, lightheadedness or syncope.   Thromboembolic risk factors are notable for hypertension age and gender  Last week she noted her defibrillator was beating. Interrogation demonstrated with a high-voltage impedance alert having transgressed at the upper limit of 100 ohms. This is been a gradual increase over the last 14 months.   Past Medical History  Diagnosis Date  . Hypertension   . Diabetic neuropathy   . Other primary cardiomyopathies   . Atrial fibrillation-paroxysmal     up to  5 hour episodes detected on her ICD  . Cardiac arrest -aborted   . Dual ICD-Medtronic   . Diabetes mellitus     Past Surgical History  Procedure Date  . Cardiac defibrillator placement 08/01/2010    [facility], Dr. Graciela Husbands  . Knee surgery   . Tonsillectomy     Current Outpatient Prescriptions  Medication Sig Dispense Refill  . aspirin 81 MG EC tablet Take 81 mg by mouth daily.        Marland Kitchen atorvastatin (LIPITOR) 40 MG tablet Take 40 mg by mouth daily.        Marland Kitchen gabapentin (NEURONTIN) 300 MG capsule Take 300 mg by mouth 2 (two) times daily.        Marland Kitchen glimepiride (AMARYL) 2 MG tablet Take 2 mg by mouth daily before breakfast.      . lisinopril (PRINIVIL,ZESTRIL) 10 MG tablet Take 1 tablet (10 mg total) by mouth daily.  90 tablet  3  . losartan-hydrochlorothiazide (HYZAAR) 100-12.5 MG per tablet Take 1 tablet by mouth daily.      . metoprolol succinate (TOPROL-XL) 25 MG 24 hr tablet Take 25 mg by mouth daily.       . nitroGLYCERIN (NITROSTAT) 0.4 MG SL tablet Place 0.4 mg under the tongue every 5 (five) minutes as needed.           Allergies  Allergen Reactions  . Codeine   . Lidocaine     Review of Systems negative except from HPI and PMH  Physical Exam BP 100/70  Pulse 72  Ht 5\' 5"  (1.651 m)  Wt 257 lb (116.574 kg)  BMI 42.77 kg/m2 Well developed and well nourished in no acute distress HENT normal E scleral and icterus clear Neck Supple JVP flat; carotids brisk and full Clear to ausculation egular rate and rhythm, no murmurs gallops or rub Soft with active bowel sounds No clubbing cyanosis no Edema Alert and oriented, grossly normal motor and sensory function Skin Warm and Dry   Assessment and  Plan

## 2011-10-11 NOTE — Assessment & Plan Note (Signed)
The patient's device was interrogated.  The information was reviewed. Upper limit for high-voltage impedance was increased

## 2011-10-11 NOTE — Assessment & Plan Note (Signed)
All have to check with [employer] regarding the implications of this. I suspect we can follow it at this time.

## 2011-10-17 ENCOUNTER — Ambulatory Visit: Payer: Self-pay | Admitting: Internal Medicine

## 2011-10-31 ENCOUNTER — Telehealth: Payer: Self-pay | Admitting: Cardiovascular Disease

## 2011-10-31 NOTE — Telephone Encounter (Signed)
Pt calling c/o cough with lisinopril. Wants to know if there is something else she can take

## 2011-10-31 NOTE — Telephone Encounter (Signed)
Pt no longer works at [phone] and home number has been disconnected per recording.  Unable to reach pt.

## 2011-11-01 NOTE — Telephone Encounter (Signed)
Hold lisinopril Monitor BP If BP is elevated (SBP .135) Would start amlodipine 5 mg daily Is she take losartan HCTZ?

## 2011-11-01 NOTE — Telephone Encounter (Signed)
Pt is calling to report that she has had a dry cough since starting Lisinopril.  The cough is dry.  She coughs all day and night.  She is requesting a switch to another medication.  She states she has not been checking her bp but will start.

## 2011-11-01 NOTE — Telephone Encounter (Signed)
Pt calling back with updated phone.

## 2011-11-02 NOTE — Telephone Encounter (Signed)
Advised patient, verbalized understanding.  Did go over medications and she is taking the losartan/hct 100/12.5 mg daily and HCTZ 25 mg daily.  The HCTZ was not on medication list, did add to list.

## 2011-11-03 ENCOUNTER — Ambulatory Visit: Payer: Self-pay | Admitting: Internal Medicine

## 2011-11-06 ENCOUNTER — Other Ambulatory Visit: Payer: Self-pay | Admitting: *Deleted

## 2011-11-06 MED ORDER — HYDROCHLOROTHIAZIDE 25 MG PO TABS: 25.0000 mg | ORAL_TABLET | Freq: Every day | ORAL | Status: DC

## 2011-12-04 ENCOUNTER — Ambulatory Visit: Payer: Self-pay | Admitting: Internal Medicine

## 2011-12-08 ENCOUNTER — Encounter: Payer: Self-pay | Admitting: Internal Medicine

## 2011-12-08 ENCOUNTER — Ambulatory Visit (INDEPENDENT_AMBULATORY_CARE_PROVIDER_SITE_OTHER): Payer: Medicare Other | Admitting: *Deleted

## 2011-12-08 DIAGNOSIS — I469 Cardiac arrest, cause unspecified: Secondary | ICD-10-CM

## 2011-12-08 DIAGNOSIS — I428 Other cardiomyopathies: Secondary | ICD-10-CM

## 2011-12-08 LAB — ICD DEVICE OBSERVATION
ATRIAL PACING ICD: 20.58 pct
CHARGE TIME: 8.828 s
FVT: 0
PACEART VT: 0
TOT-0001: 1
TZAT-0001FASTVT: 1
TZAT-0001SLOWVT: 1
TZAT-0002ATACH: NEGATIVE
TZAT-0002ATACH: NEGATIVE
TZAT-0002FASTVT: NEGATIVE
TZAT-0011SLOWVT: 10 ms
TZAT-0011SLOWVT: 10 ms
TZAT-0018ATACH: NEGATIVE
TZAT-0018ATACH: NEGATIVE
TZAT-0018SLOWVT: NEGATIVE
TZAT-0018SLOWVT: NEGATIVE
TZAT-0019ATACH: 6 V
TZAT-0019ATACH: 6 V
TZAT-0019SLOWVT: 8 V
TZAT-0019SLOWVT: 8 V
TZAT-0020ATACH: 1.5 ms
TZAT-0020ATACH: 1.5 ms
TZON-0005SLOWVT: 12
TZST-0001ATACH: 5
TZST-0001FASTVT: 3
TZST-0001FASTVT: 4
TZST-0001FASTVT: 5
TZST-0001SLOWVT: 4
TZST-0002ATACH: NEGATIVE
TZST-0002ATACH: NEGATIVE
TZST-0002ATACH: NEGATIVE
TZST-0002FASTVT: NEGATIVE
TZST-0002FASTVT: NEGATIVE
TZST-0003SLOWVT: 35 J
TZST-0003SLOWVT: 35 J

## 2011-12-08 NOTE — Progress Notes (Signed)
ICD interrogation for atrial fib episodes and evaluation of ICM

## 2011-12-19 ENCOUNTER — Encounter: Payer: Self-pay | Admitting: Cardiovascular Disease

## 2011-12-19 ENCOUNTER — Ambulatory Visit (INDEPENDENT_AMBULATORY_CARE_PROVIDER_SITE_OTHER): Payer: Medicare Other | Admitting: Cardiovascular Disease

## 2011-12-19 VITALS — BP 127/79 | HR 69 | Ht 65.0 in | Wt 255.8 lb

## 2011-12-19 DIAGNOSIS — E785 Hyperlipidemia, unspecified: Secondary | ICD-10-CM

## 2011-12-19 DIAGNOSIS — I4891 Unspecified atrial fibrillation: Secondary | ICD-10-CM

## 2011-12-19 DIAGNOSIS — Z95810 Presence of automatic (implantable) cardiac defibrillator: Secondary | ICD-10-CM

## 2011-12-19 DIAGNOSIS — I10 Essential (primary) hypertension: Secondary | ICD-10-CM

## 2011-12-19 NOTE — Assessment & Plan Note (Addendum)
Followed by Dr. Graciela Husbands for her ICD, history of ventricular arrest. Concerned recently about rise in optivol measurement. We have discussed this with her and suggested she cut back on her salt intake and to not drink excessive fluids. Recent concern about orthostatic symptoms we will hold her HCTZ for a trial period.

## 2011-12-19 NOTE — Progress Notes (Signed)
Patient ID: Lacey Lawrence, female    DOB: [birth year], 66 y.o.   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 She reports that she is borderline diabetic.  Overall she reports that she is doing well. She does have some dizziness when she first stands up almost on a daily basis. She has never had any passout spells. She has to stand for several minutes before symptoms resolve and then she is fine .  She has a second type of symptom described as a spinning that happens when she is sleeping in bed. This is worse when she turns over. She has seen ear nose throat for this symptom and was started on a medication though she does not remember the name. She takes it daily and does not think it is helping much and she continues to have problems when in bed at nighttime turning over.     Cardiac cath on 07/29/2010 showed 40% and 30% lesions in the mid LAD, 30% lesion in a small LCX.    Old EKG shows NSR with LBBB, rate of 61 bpm, A. Sensed      Outpatient Encounter Prescriptions as of 12/19/2011  Medication Sig Dispense Refill  . aspirin 81 MG EC tablet Take 81 mg by mouth daily.        Marland Kitchen atorvastatin (LIPITOR) 40 MG tablet Take 40 mg by mouth daily.        Marland Kitchen gabapentin (NEURONTIN) 300 MG capsule Take 1,500 mg by mouth daily.       . hydrochlorothiazide (HYDRODIURIL) 25 MG tablet Take 1 tablet (25 mg total) by mouth daily.  30 tablet  5  . losartan-hydrochlorothiazide (HYZAAR) 100-12.5 MG per tablet Take 1 tablet by mouth daily.      . metFORMIN (GLUCOPHAGE) 500 MG tablet Take 500 mg by mouth daily with breakfast.      . metoprolol succinate  (TOPROL-XL) 25 MG 24 hr tablet Take 25 mg by mouth daily.        . nitroGLYCERIN (NITROSTAT) 0.4 MG SL tablet Place 0.4 mg under the tongue every 5 (five) minutes as needed.         Review of Systems  Constitutional: Negative.   HENT: Negative.   Eyes: Negative.   Respiratory: Negative.   Cardiovascular: Negative.   Gastrointestinal: Negative.   Musculoskeletal: Negative.   Skin: Negative.   Neurological: Positive for dizziness and light-headedness.  Hematological: Negative.   Psychiatric/Behavioral: Negative.   All other systems reviewed and are negative.    BP 127/79  Pulse 69  Ht 5\' 5"  (1.651 m)  Wt 255 lb 12.8 oz (116.03 kg)  BMI 42.57 kg/m2  Physical Exam  Nursing note and vitals reviewed. Constitutional: She is oriented to person, place, and time. She appears well-developed and well-nourished.       Obese  HENT:  Head: Normocephalic.  Nose: Nose normal.  Mouth/Throat: Oropharynx is clear and moist.  Eyes: Conjunctivae are normal. Pupils are equal, round, and reactive to light.  Neck: Normal range of motion. Neck supple. No JVD present.  Cardiovascular: Normal rate, regular rhythm, S1 normal, S2 normal, normal heart sounds and intact distal pulses.  Exam reveals no gallop and  no friction rub.   No murmur heard. Pulmonary/Chest: Effort normal and breath sounds normal. No respiratory distress. She has no wheezes. She has no rales. She exhibits no tenderness.  Abdominal: Soft. Bowel sounds are normal. She exhibits no distension. There is no tenderness.  Musculoskeletal: Normal range of motion. She exhibits no edema and no tenderness.  Lymphadenopathy:    She has no cervical adenopathy.  Neurological: She is alert and oriented to person, place, and time. Coordination normal.  Skin: Skin is warm and dry. No rash noted. No erythema.  Psychiatric: She has a normal mood and affect. Her behavior is normal. Judgment and thought content normal.         Assessment and Plan

## 2011-12-19 NOTE — Patient Instructions (Addendum)
You are doing well.  Please hold the HCTZ for one week to see if dizzy episodes get better when standing   Monitor your blood pressure  Please call us if you have new issues that need to be addressed before your next appt.  Your physician wants you to follow-up in: 6 months.  You will receive a reminder letter in the mail two months in advance. If you don't receive a letter, please call our office to schedule the follow-up appointment.

## 2011-12-19 NOTE — Assessment & Plan Note (Signed)
We will try to obtain her most recent cholesterol panel from Dr. Judithann Graves for our records.

## 2011-12-19 NOTE — Assessment & Plan Note (Signed)
Blood pressure is stable. She measures her numbers at home and has had excellent measurements. I am concerned about her dizziness, possible orthostasis and have suggested she hold her HCTZ for one week.

## 2011-12-19 NOTE — Assessment & Plan Note (Signed)
On her last ICD interrogation, only one mode switch. She denies any palpitations or tachycardia concerning for recent atrial fibrillation. We'll continue beta blocker at current dose given recent possible orthostasis.

## 2012-03-08 ENCOUNTER — Ambulatory Visit (INDEPENDENT_AMBULATORY_CARE_PROVIDER_SITE_OTHER): Payer: Medicare Other | Admitting: *Deleted

## 2012-03-08 ENCOUNTER — Encounter: Payer: Self-pay | Admitting: Internal Medicine

## 2012-03-08 DIAGNOSIS — I469 Cardiac arrest, cause unspecified: Secondary | ICD-10-CM

## 2012-03-08 DIAGNOSIS — I428 Other cardiomyopathies: Secondary | ICD-10-CM

## 2012-03-08 LAB — ICD DEVICE OBSERVATION
AL IMPEDENCE ICD: 532 Ohm
AL THRESHOLD: 0.74 V
BAMS-0001: 170 {beats}/min
BATTERY VOLTAGE: 3.121 V
CHARGE TIME: 9.178 s
FVT: 0
PACEART VT: 0
RV LEAD AMPLITUDE: 13.75 mv
RV LEAD IMPEDENCE ICD: 836 Ohm
TOT-0001: 1
TZAT-0001ATACH: 1
TZAT-0001ATACH: 2
TZAT-0001SLOWVT: 1
TZAT-0001SLOWVT: 2
TZAT-0002ATACH: NEGATIVE
TZAT-0004SLOWVT: 8
TZAT-0004SLOWVT: 8
TZAT-0005SLOWVT: 88 pct
TZAT-0005SLOWVT: 91 pct
TZAT-0012ATACH: 150 ms
TZAT-0012ATACH: 150 ms
TZAT-0012SLOWVT: 200 ms
TZAT-0013SLOWVT: 1
TZAT-0013SLOWVT: 2
TZAT-0018ATACH: NEGATIVE
TZAT-0018ATACH: NEGATIVE
TZAT-0018FASTVT: NEGATIVE
TZAT-0019ATACH: 6 V
TZAT-0019FASTVT: 8 V
TZAT-0020FASTVT: 1.5 ms
TZON-0003ATACH: 350 ms
TZON-0003SLOWVT: 340 ms
TZON-0004SLOWVT: 28
TZON-0004VSLOWVT: 32
TZST-0001ATACH: 4
TZST-0001FASTVT: 2
TZST-0001FASTVT: 6
TZST-0001SLOWVT: 3
TZST-0001SLOWVT: 6
TZST-0002ATACH: NEGATIVE
TZST-0002ATACH: NEGATIVE
TZST-0002FASTVT: NEGATIVE
TZST-0002FASTVT: NEGATIVE
TZST-0003SLOWVT: 35 J
TZST-0003SLOWVT: 35 J
VENTRICULAR PACING ICD: 0.19 pct

## 2012-03-08 NOTE — Progress Notes (Signed)
ICD check with ICM 

## 2012-05-22 ENCOUNTER — Encounter: Payer: Self-pay | Admitting: *Deleted

## 2012-06-07 ENCOUNTER — Ambulatory Visit (INDEPENDENT_AMBULATORY_CARE_PROVIDER_SITE_OTHER): Payer: Medicare Other | Admitting: Cardiovascular Disease

## 2012-06-07 ENCOUNTER — Ambulatory Visit (INDEPENDENT_AMBULATORY_CARE_PROVIDER_SITE_OTHER): Payer: Medicare Other | Admitting: *Deleted

## 2012-06-07 ENCOUNTER — Encounter: Payer: Self-pay | Admitting: Cardiovascular Disease

## 2012-06-07 ENCOUNTER — Encounter: Payer: Self-pay | Admitting: Internal Medicine

## 2012-06-07 VITALS — BP 110/70 | HR 70 | Ht 65.0 in | Wt 254.8 lb

## 2012-06-07 DIAGNOSIS — Z95810 Presence of automatic (implantable) cardiac defibrillator: Secondary | ICD-10-CM

## 2012-06-07 DIAGNOSIS — I4891 Unspecified atrial fibrillation: Secondary | ICD-10-CM

## 2012-06-07 DIAGNOSIS — I10 Essential (primary) hypertension: Secondary | ICD-10-CM

## 2012-06-07 DIAGNOSIS — E785 Hyperlipidemia, unspecified: Secondary | ICD-10-CM

## 2012-06-07 DIAGNOSIS — I428 Other cardiomyopathies: Secondary | ICD-10-CM

## 2012-06-07 LAB — ICD DEVICE OBSERVATION
AL AMPLITUDE: 3.25 mv
AL IMPEDENCE ICD: 589 Ohm
AL THRESHOLD: 0.5 V
ATRIAL PACING ICD: 31.37 pct
BAMS-0001: 170 {beats}/min
BATTERY VOLTAGE: 3.1347 V
CHARGE TIME: 9.178 s
DEV-0020ICD: NEGATIVE
FVT: 0
HV IMPEDENCE: 85 Ohm
PACEART VT: 0
RV LEAD AMPLITUDE: 17.75 mv
RV LEAD IMPEDENCE ICD: 836 Ohm
RV LEAD THRESHOLD: 1.25 V
TOT-0001: 1
TOT-0002: 0
TOT-0006: 20111128000000
TZAT-0001ATACH: 1
TZAT-0001ATACH: 2
TZAT-0001ATACH: 3
TZAT-0001FASTVT: 1
TZAT-0001SLOWVT: 1
TZAT-0001SLOWVT: 2
TZAT-0002ATACH: NEGATIVE
TZAT-0002ATACH: NEGATIVE
TZAT-0002ATACH: NEGATIVE
TZAT-0002FASTVT: NEGATIVE
TZAT-0004SLOWVT: 8
TZAT-0004SLOWVT: 8
TZAT-0005SLOWVT: 88 pct
TZAT-0005SLOWVT: 91 pct
TZAT-0011SLOWVT: 10 ms
TZAT-0011SLOWVT: 10 ms
TZAT-0012ATACH: 150 ms
TZAT-0012ATACH: 150 ms
TZAT-0012ATACH: 150 ms
TZAT-0012FASTVT: 200 ms
TZAT-0012SLOWVT: 200 ms
TZAT-0012SLOWVT: 200 ms
TZAT-0013SLOWVT: 1
TZAT-0013SLOWVT: 2
TZAT-0018ATACH: NEGATIVE
TZAT-0018ATACH: NEGATIVE
TZAT-0018ATACH: NEGATIVE
TZAT-0018FASTVT: NEGATIVE
TZAT-0018SLOWVT: NEGATIVE
TZAT-0018SLOWVT: NEGATIVE
TZAT-0019ATACH: 6 V
TZAT-0019ATACH: 6 V
TZAT-0019ATACH: 6 V
TZAT-0019FASTVT: 8 V
TZAT-0019SLOWVT: 8 V
TZAT-0019SLOWVT: 8 V
TZAT-0020ATACH: 1.5 ms
TZAT-0020ATACH: 1.5 ms
TZAT-0020ATACH: 1.5 ms
TZAT-0020FASTVT: 1.5 ms
TZAT-0020SLOWVT: 1.5 ms
TZAT-0020SLOWVT: 1.5 ms
TZON-0003ATACH: 350 ms
TZON-0003SLOWVT: 340 ms
TZON-0003VSLOWVT: 450 ms
TZON-0004SLOWVT: 28
TZON-0004VSLOWVT: 32
TZON-0005SLOWVT: 12
TZST-0001ATACH: 4
TZST-0001ATACH: 5
TZST-0001ATACH: 6
TZST-0001FASTVT: 2
TZST-0001FASTVT: 3
TZST-0001FASTVT: 4
TZST-0001FASTVT: 5
TZST-0001FASTVT: 6
TZST-0001SLOWVT: 3
TZST-0001SLOWVT: 4
TZST-0001SLOWVT: 5
TZST-0001SLOWVT: 6
TZST-0002ATACH: NEGATIVE
TZST-0002ATACH: NEGATIVE
TZST-0002ATACH: NEGATIVE
TZST-0002FASTVT: NEGATIVE
TZST-0002FASTVT: NEGATIVE
TZST-0002FASTVT: NEGATIVE
TZST-0002FASTVT: NEGATIVE
TZST-0002FASTVT: NEGATIVE
TZST-0003SLOWVT: 25 J
TZST-0003SLOWVT: 35 J
TZST-0003SLOWVT: 35 J
TZST-0003SLOWVT: 35 J
VENTRICULAR PACING ICD: 0.19 pct
VF: 0

## 2012-06-07 NOTE — Assessment & Plan Note (Signed)
Blood pressure is well controlled on today's visit. No changes made to the medications. 

## 2012-06-07 NOTE — Progress Notes (Signed)
Patient ID: Lacey Lawrence, female    DOB: 07/18/1946, 66 y.o.   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 She reports that she is borderline diabetic.  Overall she reports that she is doing well. She's not exercising on a regular basis as she has chronic pain in her feet. She walks around the house and the yard but no regular exercise. She denies any significant shortness of breath or chest pain. She does have occasional lower extremity edema. She did not discuss any dizziness or spinning symptoms that she's had previously.    Cardiac cath on 07/29/2010 showed 40% and 30% lesions in the mid LAD, 30% lesion in a small LCX.    EKG shows NSR with LBBB, rate of 70 bpm    Outpatient Encounter Prescriptions as of 06/07/2012  Medication Sig Dispense Refill  . aspirin 81 MG EC tablet Take 81 mg by mouth daily.        Marland Kitchen atorvastatin (LIPITOR) 40 MG tablet Take 40 mg by mouth daily.        Marland Kitchen gabapentin (NEURONTIN) 300 MG capsule Take 1,500 mg by mouth daily.       . hydrochlorothiazide (HYDRODIURIL) 25 MG tablet Take 1 tablet (25 mg total) by mouth daily.  30 tablet  5  . losartan-hydrochlorothiazide (HYZAAR) 100-12.5 MG per tablet Take 1 tablet by mouth daily.      . metFORMIN (GLUCOPHAGE) 500 MG tablet Take 500 mg by mouth daily with breakfast.      . metoprolol succinate (TOPROL-XL) 25 MG 24 hr tablet Take 25 mg by mouth daily.        . nitroGLYCERIN (NITROSTAT) 0.4 MG SL tablet Place 0.4 mg under the tongue every 5 (five) minutes as needed.          Review of Systems  Constitutional: Negative.   HENT: Negative.   Eyes: Negative.     Respiratory: Negative.   Cardiovascular: Negative.   Gastrointestinal: Negative.   Musculoskeletal: Negative.        Chronic foot pain  Skin: Negative.   Hematological: Negative.   Psychiatric/Behavioral: Negative.   All other systems reviewed and are negative.    BP 110/70  Pulse 70  Ht 5\' 5"  (1.651 m)  Wt 254 lb 12 oz (115.554 kg)  BMI 42.39 kg/m2  Physical Exam  Nursing note and vitals reviewed. Constitutional: She is oriented to person, place, and time. She appears well-developed and well-nourished.       Obese  HENT:  Head: Normocephalic.  Nose: Nose normal.  Mouth/Throat: Oropharynx is clear and moist.  Eyes: Conjunctivae normal are normal. Pupils are equal, round, and reactive to light.  Neck: Normal range of motion. Neck supple. No JVD present.  Cardiovascular: Normal rate, regular rhythm, S1 normal, S2 normal, normal heart sounds and intact distal pulses.  Exam reveals no gallop and no friction rub.   No murmur heard. Pulmonary/Chest: Effort normal and breath sounds normal. No respiratory distress. She has no wheezes. She has no rales. She exhibits no tenderness.  Abdominal: Soft. Bowel sounds are normal. She exhibits no distension. There is no tenderness.  Musculoskeletal: Normal range of motion. She exhibits no edema and  no tenderness.  Lymphadenopathy:    She has no cervical adenopathy.  Neurological: She is alert and oriented to person, place, and time. Coordination normal.  Skin: Skin is warm and dry. No rash noted. No erythema.  Psychiatric: She has a normal mood and affect. Her behavior is normal. Judgment and thought content normal.         Assessment and Plan

## 2012-06-07 NOTE — Assessment & Plan Note (Signed)
ICD check today. Optimal shows mild increase. We will recommend watching her salt intake and fluids.

## 2012-06-07 NOTE — Progress Notes (Signed)
ICD check with ICM 

## 2012-06-07 NOTE — Patient Instructions (Addendum)
You are doing well. No medication changes were made.  Please call us if you have new issues that need to be addressed before your next appt.  Your physician wants you to follow-up in: 6 months.  You will receive a reminder letter in the mail two months in advance. If you don't receive a letter, please call our office to schedule the follow-up appointment.   

## 2012-06-07 NOTE — Assessment & Plan Note (Signed)
Goal LDL less than 70. We have suggested she continue her Lipitor.

## 2012-09-10 ENCOUNTER — Telehealth: Payer: Self-pay | Admitting: Internal Medicine

## 2012-09-10 ENCOUNTER — Ambulatory Visit (INDEPENDENT_AMBULATORY_CARE_PROVIDER_SITE_OTHER): Payer: Medicare Other | Admitting: Internal Medicine

## 2012-09-10 ENCOUNTER — Encounter: Payer: Self-pay | Admitting: Internal Medicine

## 2012-09-10 ENCOUNTER — Other Ambulatory Visit: Payer: Self-pay

## 2012-09-10 VITALS — BP 129/84 | HR 73 | Ht 65.0 in | Wt 253.0 lb

## 2012-09-10 DIAGNOSIS — I4891 Unspecified atrial fibrillation: Secondary | ICD-10-CM

## 2012-09-10 DIAGNOSIS — T82198A Other mechanical complication of other cardiac electronic device, initial encounter: Secondary | ICD-10-CM

## 2012-09-10 DIAGNOSIS — I469 Cardiac arrest, cause unspecified: Secondary | ICD-10-CM

## 2012-09-10 DIAGNOSIS — I10 Essential (primary) hypertension: Secondary | ICD-10-CM

## 2012-09-10 DIAGNOSIS — I428 Other cardiomyopathies: Secondary | ICD-10-CM

## 2012-09-10 DIAGNOSIS — Z95810 Presence of automatic (implantable) cardiac defibrillator: Secondary | ICD-10-CM

## 2012-09-10 LAB — ICD DEVICE OBSERVATION
AL THRESHOLD: 0.5 V
ATRIAL PACING ICD: 15.77 pct
BATTERY VOLTAGE: 3.1142 V
CHARGE TIME: 9.379 s
DEV-0020ICD: NEGATIVE
PACEART VT: 0
TOT-0002: 0
TZAT-0001ATACH: 1
TZAT-0002ATACH: NEGATIVE
TZAT-0002ATACH: NEGATIVE
TZAT-0002ATACH: NEGATIVE
TZAT-0002FASTVT: NEGATIVE
TZAT-0005SLOWVT: 88 pct
TZAT-0005SLOWVT: 91 pct
TZAT-0011SLOWVT: 10 ms
TZAT-0011SLOWVT: 10 ms
TZAT-0012ATACH: 150 ms
TZAT-0013SLOWVT: 1
TZAT-0013SLOWVT: 2
TZAT-0018ATACH: NEGATIVE
TZAT-0018ATACH: NEGATIVE
TZAT-0018ATACH: NEGATIVE
TZAT-0018SLOWVT: NEGATIVE
TZAT-0018SLOWVT: NEGATIVE
TZAT-0019ATACH: 6 V
TZAT-0019ATACH: 6 V
TZAT-0019FASTVT: 8 V
TZAT-0020ATACH: 1.5 ms
TZAT-0020FASTVT: 1.5 ms
TZON-0003ATACH: 350 ms
TZON-0004SLOWVT: 28
TZON-0005SLOWVT: 12
TZST-0001ATACH: 4
TZST-0001ATACH: 5
TZST-0001FASTVT: 3
TZST-0001SLOWVT: 4
TZST-0001SLOWVT: 6
TZST-0002ATACH: NEGATIVE
TZST-0002FASTVT: NEGATIVE
TZST-0002FASTVT: NEGATIVE
TZST-0003SLOWVT: 35 J
TZST-0003SLOWVT: 35 J
VENTRICULAR PACING ICD: 0.12 pct

## 2012-09-10 MED ORDER — METOPROLOL SUCCINATE ER 25 MG PO TB24: 25.0000 mg | ORAL_TABLET | Freq: Every day | ORAL | Status: DC

## 2012-09-10 NOTE — Assessment & Plan Note (Signed)
Stable. This is a single: System

## 2012-09-10 NOTE — Patient Instructions (Addendum)
Your physician wants you to follow-up in: 6 months with Dr. Mariah Milling and 1 year with Dr. Graciela Husbands. You will receive a reminder letter in the mail two months in advance. If you don't receive a letter, please call our office to schedule the follow-up appointment.  Call your pharmacy to compare prices of Xarelto, Pradaxa, Eliquis and Warfarin  Call us back to clarify which med you are taking: hydrochlorothiazide (Hydrodiuril) OR losartan/HCTZ ([phone]

## 2012-09-10 NOTE — Assessment & Plan Note (Signed)
She continues to have episodes of atrial fibrillation now greater than 5-10 hours. With her thromboembolic risk profile, I think is appropriate to put her on anticoagulation other than aspirin. She will query her pharmacist to find out relative costs and let us know

## 2012-09-10 NOTE — Assessment & Plan Note (Signed)
The patient's device was interrogated.  The information was reviewed. No changes were made in the programming.    

## 2012-09-10 NOTE — Progress Notes (Signed)
Patient Care Team: Bari Edward, MD as PCP - General (Internal Medicine)   HPI  Lacey Lawrence is a 67 y.o. female seen in followup for an ICD implantation for cardiac arrest. This occurred in November. It occurs in the setting of normal left ventricular function mild nonobstructive coronary disease and left bundle-branch block. 2011 and the is The patient denies chest pain, shortness of breath, nocturnal dyspnea, orthopnea or peripheral edema. There have been no palpitations, She does have some orthostatic lightheadedness; this can be worse in the shower area. Her medication list has been taking 2 different diuretics. We will need to clarify this. .  Interrogation of her device demonstrates atrial fibrillation  Thromboembolic risk factors are notable for hypertension age and gender     Past Medical History  Diagnosis Date  . Hypertension   . Diabetic neuropathy   . Other primary cardiomyopathies   . Atrial fibrillation-paroxysmal     up to  5 hour episodes detected on her ICD  . Cardiac arrest -aborted   . Dual ICD-Medtronic   . Diabetes mellitus     Past Surgical History  Procedure Date  . Cardiac defibrillator placement 08/01/2010    [facility], Dr. Graciela Husbands  . Knee surgery   . Tonsillectomy     Current Outpatient Prescriptions  Medication Sig Dispense Refill  . aspirin 81 MG EC tablet Take 81 mg by mouth daily.        Marland Kitchen atorvastatin (LIPITOR) 40 MG tablet Take 40 mg by mouth daily.        Marland Kitchen gabapentin (NEURONTIN) 300 MG capsule Take 1,500 mg by mouth daily.       . hydrochlorothiazide (HYDRODIURIL) 25 MG tablet Take 1 tablet (25 mg total) by mouth daily.  30 tablet  5  . losartan-hydrochlorothiazide (HYZAAR) 100-12.5 MG per tablet Take 1 tablet by mouth daily.      . metFORMIN (GLUCOPHAGE) 500 MG tablet Take 500 mg by mouth daily with breakfast.      . metoprolol succinate (TOPROL-XL) 25 MG 24 hr tablet Take 25 mg by mouth daily.        . nitroGLYCERIN (NITROSTAT) 0.4 MG  SL tablet Place 0.4 mg under the tongue every 5 (five) minutes as needed.          Allergies  Allergen Reactions  . Codeine   . Lidocaine   . Lisinopril     cough    Review of Systems negative except from HPI and PMH  Physical Exam BP 129/84  Pulse 73  Ht 5\' 5"  (1.651 m)  Wt 253 lb (114.76 kg)  BMI 42.10 kg/m2 Well developed and well nourished in no acute distress HENT normal E scleral and icterus clear Neck Supple JVP flat; carotids brisk and full Clear to ausculation  Regular rate and rhythm, no murmurs gallops or rub Soft with active bowel sounds No clubbing cyanosis none Edema Alert and oriented, grossly normal motor and sensory function Skin Warm and Dry    Assessment and  Plan

## 2012-09-10 NOTE — Telephone Encounter (Signed)
Pt gave me med list as follows: Metoprolol 25 mg qd Losartan/HCTZ 100/12.5 mg daily HCTZ 25 mg daily Metformin Atorvastatin ASA NTG PRN  I will share this info with Dr. Graciela Husbands and call her back with instructions [id number] diuretic to stop Understanding verb

## 2012-09-10 NOTE — Telephone Encounter (Signed)
"  stop HCTZ. May want to stop losartan/HCT at next office visit" VO Dr. Amedeo Gory Pt informed Understanding verb

## 2012-09-10 NOTE — Assessment & Plan Note (Signed)
Blood pressure is controlled. She is on objective at his orthostatic intolerance with a change in her heart rate of almost 20 beats per minute. She also has shower intolerance. According to her drug list, which has not had clarified, she is taking 2 different diuretics. I would anticipate that we will discontinue her diuretics when she called [place] and let us know when she is in fact taking

## 2012-09-10 NOTE — Assessment & Plan Note (Signed)
No intercurrent ventricular arrhythmias 

## 2012-09-10 NOTE — Telephone Encounter (Signed)
Pt was calling back to give nurse list of meds

## 2012-09-11 ENCOUNTER — Other Ambulatory Visit: Payer: Self-pay

## 2012-09-11 MED ORDER — ATORVASTATIN CALCIUM 40 MG PO TABS: 40.0000 mg | ORAL_TABLET | Freq: Every day | ORAL | Status: DC

## 2012-09-11 MED ORDER — METOPROLOL SUCCINATE ER 25 MG PO TB24: 25.0000 mg | ORAL_TABLET | Freq: Every day | ORAL | Status: DC

## 2012-09-17 ENCOUNTER — Telehealth: Payer: Self-pay

## 2012-09-17 NOTE — Telephone Encounter (Signed)
I called pt to see if she has gotten any price info on anticoagulants discussed with Dr. Graciela Husbands at last OV She says she has not but will call this am and will call us back with decision

## 2012-09-17 NOTE — Telephone Encounter (Signed)
Pt called back, says [facility] needs more info re: meds before they can give her prices Pt verbalized that she is frustrated in having to do this I told her I would take care of this for her and call her back with prices Understanding verb.

## 2012-09-18 NOTE — Telephone Encounter (Signed)
I spoke with pt about costs of meds She chose to start warfarin I explained coumadin clinic and the possibility of having to have INR checked at least monthly She is willing to do this I told her I would check dose with Dr. Graciela Husbands and will call her back with detailed instructions Understanding verb

## 2012-09-18 NOTE — Telephone Encounter (Signed)
Please see below and advise which dose warfarin is best for pt thanks

## 2012-09-18 NOTE — Telephone Encounter (Signed)
I spoke with Sunny Schlein with [facility] She gave me costs of meds as follows: Eliquis-$268.51 prior to deductible x 30 day supply             $53.60 after deductible met Warfarin-$5.00 prior to deductible/month               - $1.00 after deductible/month pradaxa-$293.24 x 1 month supply prior to deductible being met              -$58.64 x 1 month supply after deductible xarelto- $266.91 x 1 month supply prior to deductible              $53.32 x 1 month supply after deductible met

## 2012-09-18 NOTE — Telephone Encounter (Signed)
I called [facility] who asks that I call [employer] to get this info They gave me # [phone]

## 2012-09-23 NOTE — Telephone Encounter (Signed)
i owuld begin on 5mg  as per protocol  Thanks steve

## 2012-09-23 NOTE — Telephone Encounter (Signed)
Please see below Per Dr. Graciela Husbands, pt is a new coumadin start at 5 mg PO QD thanks

## 2012-09-26 ENCOUNTER — Telehealth: Payer: Self-pay

## 2012-09-26 NOTE — Telephone Encounter (Signed)
Pt needs appt for new coumadin start Can you help me with this? :) Thanks!!

## 2012-09-30 NOTE — Telephone Encounter (Signed)
Called spoke with pt, pt states she still has not started on Coumadin because she has not received medication from mail order pharmacy.  Pt is frustrated doesn't want to come into office weekly for monitoring.

## 2012-10-01 ENCOUNTER — Telehealth: Payer: Self-pay

## 2012-10-01 NOTE — Telephone Encounter (Signed)
FYI...see below  Luan Moore, RN 09/30/2012 5:13 PM Signed  Called spoke with pt, pt states she still has not started on Coumadin because she has not received medication from mail order pharmacy. Pt is frustrated doesn't want to come into office weekly for monitoring.  Marcelle Overlie, RN 09/26/2012 8:30 AM Signed  Pt needs appt for new coumadin start  Can you help me with this? :)  Thanks!!

## 2012-10-07 ENCOUNTER — Other Ambulatory Visit: Payer: Self-pay

## 2012-10-07 ENCOUNTER — Telehealth: Payer: Self-pay

## 2012-10-07 MED ORDER — WARFARIN SODIUM 5 MG PO TABS: 5.0000 mg | ORAL_TABLET | Freq: Every day | ORAL | Status: DC

## 2012-10-07 NOTE — Telephone Encounter (Signed)
I called pt to see if she has received warfarin from mail order pharmacy yet She says she has not received this and she called [facility] to see if this had been called in. She says they told her they never received RX. I told her I would send in again. I then asked her if she is willing to take med as we prescribe and come to coumadin clinic, weekly in the beginning.  She says she is willing to do this. She states, "I though I had to come twice weekly". I explained, no only once weekly for a few weeks/until therapeutic. She is agreeable to coumadin management. RX resent to [facility] at pt request. She does not want this sent to local pharmacy.

## 2012-10-09 ENCOUNTER — Telehealth: Payer: Self-pay

## 2012-10-09 NOTE — Telephone Encounter (Signed)
Pt asks that I call [facility] re: warfarin b/c they tell her they have an "issue " with the med and have been trying to contact us. I will call [facility]

## 2012-10-09 NOTE — Telephone Encounter (Signed)
I spoke with pharmacy tech with [facility]. They have show that pt's warfarin was shipped yesterday and no other notes on acct  I called pt to inform her about this Understanding verb

## 2012-10-09 NOTE — Telephone Encounter (Signed)
Message copied by [facility], Ambriana Selway E on Wed Oct 09, 2012 12:10 PM ------      Message from: Thersa Salt      Created: Wed Oct 09, 2012 12:00 PM      Regarding: prescription question       Please call pt concerning her scripts

## 2012-10-15 ENCOUNTER — Ambulatory Visit (INDEPENDENT_AMBULATORY_CARE_PROVIDER_SITE_OTHER): Payer: Medicare Other

## 2012-10-15 DIAGNOSIS — I4891 Unspecified atrial fibrillation: Secondary | ICD-10-CM

## 2012-10-19 ENCOUNTER — Other Ambulatory Visit: Payer: Self-pay

## 2012-10-23 ENCOUNTER — Ambulatory Visit (INDEPENDENT_AMBULATORY_CARE_PROVIDER_SITE_OTHER): Payer: Medicare Other

## 2012-10-23 DIAGNOSIS — I4891 Unspecified atrial fibrillation: Secondary | ICD-10-CM

## 2012-10-23 DIAGNOSIS — Z7901 Long term (current) use of anticoagulants: Secondary | ICD-10-CM | POA: Insufficient documentation

## 2012-10-30 ENCOUNTER — Ambulatory Visit (INDEPENDENT_AMBULATORY_CARE_PROVIDER_SITE_OTHER): Payer: Medicare Other

## 2012-10-30 DIAGNOSIS — I4891 Unspecified atrial fibrillation: Secondary | ICD-10-CM

## 2012-10-30 DIAGNOSIS — Z7901 Long term (current) use of anticoagulants: Secondary | ICD-10-CM

## 2012-10-30 LAB — POCT INR: INR: 1.1

## 2012-11-06 ENCOUNTER — Ambulatory Visit (INDEPENDENT_AMBULATORY_CARE_PROVIDER_SITE_OTHER): Payer: Medicare Other

## 2012-11-06 DIAGNOSIS — I4891 Unspecified atrial fibrillation: Secondary | ICD-10-CM

## 2012-11-06 DIAGNOSIS — Z7901 Long term (current) use of anticoagulants: Secondary | ICD-10-CM

## 2012-11-06 LAB — POCT INR: INR: 2.5

## 2012-11-13 ENCOUNTER — Ambulatory Visit (INDEPENDENT_AMBULATORY_CARE_PROVIDER_SITE_OTHER): Payer: Medicare Other

## 2012-11-13 DIAGNOSIS — Z7901 Long term (current) use of anticoagulants: Secondary | ICD-10-CM

## 2012-11-13 DIAGNOSIS — I4891 Unspecified atrial fibrillation: Secondary | ICD-10-CM

## 2012-11-13 LAB — POCT INR: INR: 3.5

## 2012-11-27 ENCOUNTER — Ambulatory Visit (INDEPENDENT_AMBULATORY_CARE_PROVIDER_SITE_OTHER): Payer: Medicare Other

## 2012-11-27 DIAGNOSIS — Z7901 Long term (current) use of anticoagulants: Secondary | ICD-10-CM

## 2012-11-27 DIAGNOSIS — I4891 Unspecified atrial fibrillation: Secondary | ICD-10-CM

## 2012-12-11 ENCOUNTER — Ambulatory Visit (INDEPENDENT_AMBULATORY_CARE_PROVIDER_SITE_OTHER): Payer: Medicare Other | Admitting: Cardiovascular Disease

## 2012-12-11 ENCOUNTER — Ambulatory Visit (INDEPENDENT_AMBULATORY_CARE_PROVIDER_SITE_OTHER): Payer: Medicare Other

## 2012-12-11 ENCOUNTER — Encounter: Payer: Self-pay | Admitting: Cardiovascular Disease

## 2012-12-11 VITALS — BP 114/72 | HR 60 | Ht 65.0 in | Wt 254.8 lb

## 2012-12-11 DIAGNOSIS — I10 Essential (primary) hypertension: Secondary | ICD-10-CM

## 2012-12-11 DIAGNOSIS — I4891 Unspecified atrial fibrillation: Secondary | ICD-10-CM

## 2012-12-11 DIAGNOSIS — R0602 Shortness of breath: Secondary | ICD-10-CM

## 2012-12-11 DIAGNOSIS — E785 Hyperlipidemia, unspecified: Secondary | ICD-10-CM

## 2012-12-11 DIAGNOSIS — Z7901 Long term (current) use of anticoagulants: Secondary | ICD-10-CM

## 2012-12-11 LAB — POCT INR: INR: 1.6

## 2012-12-11 NOTE — Assessment & Plan Note (Signed)
Blood pressure is well controlled on today's visit. No changes made to the medications. 

## 2012-12-11 NOTE — Assessment & Plan Note (Signed)
Continue on generic Lipitor. No recent lab work available

## 2012-12-11 NOTE — Assessment & Plan Note (Signed)
She denies any tachycardia or palpitations. No changes made to her medications

## 2012-12-11 NOTE — Patient Instructions (Addendum)
You are doing well. No medication changes were made.  Please take lasix as needed for worsening ankle edema  Please call us if you have new issues that need to be addressed before your next appt.  Your physician wants you to follow-up in: 6 months.  You will receive a reminder letter in the mail two months in advance. If you don't receive a letter, please call our office to schedule the follow-up appointment.

## 2012-12-11 NOTE — Progress Notes (Signed)
Patient ID: Lacey Lawrence, female    DOB: 1946/01/17, 67 y.o.   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  she is borderline diabetic.  Overall she reports that she is doing well. She's not exercising  as she has chronic pain in her feet. She walks around the house and the yard. She denies any significant shortness of breath or chest pain. She does have occasional lower extremity edema.  Overall she has no new complaints. Weight has been stable. Recently went gambling. She travels to [place]    Cardiac cath on 07/29/2010 showed 40% and 30% lesions in the mid LAD, 30% lesion in a small LCX.    EKG shows paced rhythm with rate 60 beats per minute    Outpatient Encounter Prescriptions as of 12/11/2012  Medication Sig Dispense Refill  . aspirin 81 MG EC tablet Take 81 mg by mouth daily.        Marland Kitchen atorvastatin (LIPITOR) 40 MG tablet Take 1 tablet (40 mg total) by mouth daily.  90 tablet  3  . gabapentin (NEURONTIN) 300 MG capsule Take 1,500 mg by mouth daily.       Marland Kitchen losartan-hydrochlorothiazide (HYZAAR) 100-12.5 MG per tablet Take 1 tablet by mouth daily.      . metFORMIN (GLUCOPHAGE) 500 MG tablet Take 500 mg by mouth daily with breakfast.      . metoprolol succinate (TOPROL-XL) 25 MG 24 hr tablet Take 1 tablet (25 mg total) by mouth daily.  90 tablet  3  . nitroGLYCERIN (NITROSTAT) 0.4 MG SL tablet Place 0.4 mg under the tongue every 5 (five) minutes as needed.        . warfarin (COUMADIN) 5 MG tablet Take 1 tablet (5 mg total) by mouth daily.  90 tablet  3   No facility-administered encounter medications on file as of 12/11/2012.     Review of Systems  Constitutional: Negative.   HENT: Negative.   Eyes: Negative.   Respiratory: Negative.   Cardiovascular: Negative.   Gastrointestinal: Negative.   Musculoskeletal: Negative.        Chronic foot pain  Skin: Negative.   Neurological: Negative.   Psychiatric/Behavioral: Negative.   All other systems reviewed and are negative.    BP 114/72  Pulse 60  Ht 5\' 5"  (1.651 m)  Wt 254 lb 12 oz (115.554 kg)  BMI 42.39 kg/m2  Physical Exam  Nursing note and vitals reviewed. Constitutional: She is oriented to person, place, and time. She appears well-developed and well-nourished.  Obese  HENT:  Head: Normocephalic.  Nose: Nose normal.  Mouth/Throat: Oropharynx is clear and moist.  Eyes: Conjunctivae are normal. Pupils are equal, round, and reactive to light.  Neck: Normal range of motion. Neck supple. No JVD present.  Cardiovascular: Normal rate, regular rhythm, S1 normal, S2 normal, normal heart sounds and intact distal pulses.  Exam reveals no gallop and no friction rub.   No murmur heard. Pulmonary/Chest: Effort normal and breath sounds normal. No respiratory distress. She has no wheezes. She has no rales. She exhibits no tenderness.  Abdominal: Soft. Bowel sounds are normal. She exhibits no distension. There is no tenderness.  Musculoskeletal: Normal range of motion.  She exhibits no edema and no tenderness.  Lymphadenopathy:    She has no cervical adenopathy.  Neurological: She is alert and oriented to person, place, and time. Coordination normal.  Skin: Skin is warm and dry. No rash noted. No erythema.  Psychiatric: She has a normal mood and affect. Her behavior is normal. Judgment and thought content normal.    Assessment and Plan

## 2012-12-25 ENCOUNTER — Ambulatory Visit (INDEPENDENT_AMBULATORY_CARE_PROVIDER_SITE_OTHER): Payer: Medicare Other

## 2012-12-25 ENCOUNTER — Ambulatory Visit (INDEPENDENT_AMBULATORY_CARE_PROVIDER_SITE_OTHER): Payer: Medicare Other | Admitting: Physician Assistant

## 2012-12-25 VITALS — BP 130/80 | HR 88 | Ht 65.0 in | Wt 254.0 lb

## 2012-12-25 DIAGNOSIS — Z7901 Long term (current) use of anticoagulants: Secondary | ICD-10-CM

## 2012-12-25 DIAGNOSIS — I4891 Unspecified atrial fibrillation: Secondary | ICD-10-CM

## 2012-12-25 LAB — POCT INR: INR: 1

## 2012-12-25 MED ORDER — RIVAROXABAN 20 MG PO TABS: 20.0000 mg | ORAL_TABLET | Freq: Every day | ORAL | Status: DC

## 2012-12-25 MED ORDER — METOPROLOL SUCCINATE ER 25 MG PO TB24: 25.0000 mg | ORAL_TABLET | Freq: Two times a day (BID) | ORAL | Status: DC

## 2012-12-25 NOTE — Assessment & Plan Note (Addendum)
Discussed with Dr. Mariah Milling. A-fib confirmed on EKG in the office. This is likely accounting for her symptoms. INR subtherapeutic today at 1.0 despite endorsing adherence with Coumadin. Outlined options including starting NOAC. Discussed continuing anticoagulation x 1 month prior to attempting DCCV. Will provide with samples of Xarelto (subtherapeutic INR today and concerns with NOAC affordability). In the meantime, will increase metoprolol to BID. Advised to call office in 1 week if symptoms worsen or persist. If a-fib persists in 1 month, will plan DCCV. Risks, benefits and details of these options were discussed at length with the patient and Dr. Mariah Milling. Total time spent > 30 min.

## 2012-12-25 NOTE — Progress Notes (Signed)
Patient ID: Lacey Lawrence, female    DOB: [birth year], 67 y.o.   MRN: [id number]  HPI Comments: 67 year old woman with history of VF arrest, admitted to the hospital on July 24, 2010 with acute respiratory failure, cardiogenic shock, idiopathic VF with CPR in the field, acute renal failure, sepsis and aspiration pneumonia who was extubated November 23 and discharged from the hospital November 29, s/p Medtronic ICD, nonobstructive CAD and PAF (on chronic Coumadin).   She presented for Coumadin check today (INR 1.0). She is seen as a work-in visit after c/o fatigue, malaise and cp.  She was seen 12/11/12 and was doing well at that time denying any significant cp or SOB. Able to work around the house without incident. Weight stable. EKG last week showed a-paced, v-sensed rhythm, 60 bpm.  She reports awaking this morning and feeling generally unwell. She noticed new onset substernal chest pressure without radiation with associated SOB and palpitations. She reports unchanged mild ankle swelling. No PND or orthopnea. Denies fevers, chills, diarrhea or urinary changes. No ICD shocks. No bleeding. Denies facial droop, slurred speech, weakness, numbness/tingling, incoordination or imbalance.     Cardiac cath on 07/29/2010 showed 40% and 30% lesions in the mid LAD, 30% lesion in a small LCX. 2D echo on 07/2010 was poor quality- EF 50%, anteroseptal and apical HK, mild-mod LVH, trace TR, nl LA/RA/RVSP.    EKG today reveals atrial fibrillation, rate 85 bpm, PVCs, LAD, LBBB.     Outpatient Encounter Prescriptions as of 12/25/2012  Medication Sig Dispense Refill  . aspirin 81 MG EC tablet Take 81 mg by mouth daily.        Marland Kitchen atorvastatin (LIPITOR) 40 MG tablet Take 1 tablet (40 mg total) by mouth daily.  90 tablet  3  . gabapentin (NEURONTIN) 300 MG capsule Take 1,500 mg by mouth daily.       Marland Kitchen losartan-hydrochlorothiazide (HYZAAR) 100-12.5 MG per tablet Take 1 tablet by mouth daily.      . metFORMIN  (GLUCOPHAGE) 500 MG tablet Take 500 mg by mouth daily with breakfast.      . metoprolol succinate (TOPROL-XL) 25 MG 24 hr tablet Take 1 tablet (25 mg total) by mouth daily.  90 tablet  3  . nitroGLYCERIN (NITROSTAT) 0.4 MG SL tablet Place 0.4 mg under the tongue every 5 (five) minutes as needed.        . warfarin (COUMADIN) 5 MG tablet Take 1 tablet (5 mg total) by mouth daily.  90 tablet  3   No facility-administered encounter medications on file as of 12/25/2012.    Review of Systems  Constitutional: Positive for fatigue.  HENT: Negative.   Eyes: Negative.   Respiratory: Positive for shortness of breath.   Cardiovascular: Positive for chest pain, palpitations and leg swelling.  Gastrointestinal: Negative.   Musculoskeletal: Negative.        Chronic foot pain  Skin: Negative.   Neurological: Negative.   Psychiatric/Behavioral: Negative.   All other systems reviewed and are negative.    BP 130/80  Pulse 88  Ht 5\' 5"  (1.651 m)  Wt 254 lb (115.214 kg)  BMI 42.27 kg/m2  Physical Exam  Nursing note and vitals reviewed. Constitutional: She is oriented to person, place, and time. She appears well-developed and well-nourished.  Obese  HENT:  Head: Normocephalic.  Nose: Nose normal.  Mouth/Throat: Oropharynx is clear and moist.  Eyes: Conjunctivae are normal. Pupils are equal, round, and reactive to light.  Neck: Normal range of  motion. Neck supple. No JVD present.  Cardiovascular: Normal rate, S1 normal, S2 normal, normal heart sounds and intact distal pulses.  Exam reveals no gallop and no friction rub.   No murmur heard. Irregularly irregular  Pulmonary/Chest: Effort normal and breath sounds normal. No respiratory distress. She has no wheezes. She has no rales. She exhibits no tenderness.  Abdominal: Soft. Bowel sounds are normal. She exhibits no distension. There is no tenderness.  Musculoskeletal: Normal range of motion. She exhibits edema. She exhibits no tenderness.  Trace  bilateral pedal edema  Lymphadenopathy:    She has no cervical adenopathy.  Neurological: She is alert and oriented to person, place, and time. Coordination normal.  Skin: Skin is warm and dry. No rash noted. No erythema.  Psychiatric: She has a normal mood and affect. Her behavior is normal. Judgment and thought content normal.    Assessment and Plan

## 2012-12-25 NOTE — Progress Notes (Signed)
Pt c/o chest tightness and "not feeling right", also c/o palpitations EKG obtained PA to see pt

## 2012-12-25 NOTE — Patient Instructions (Addendum)
Please stop taking warfarin (Coumadin). We will give you samples of Xarelto. Please take this as prescribed (one tablet daily).  Increase metoprolol succinate (Toprol-XL) to 25 mg (one tablet) twice a day.   Please call the office 1 week to let us know how you are feeling.   We will schedule a time for you to undergo a cardioversion at the hospital if you stay in atrial fibrillation and continue to have symptoms.

## 2012-12-31 ENCOUNTER — Telehealth: Payer: Self-pay

## 2012-12-31 NOTE — Telephone Encounter (Signed)
Pt reports she is feeling no better since med changes made at last OV I will let Dr. Mariah Milling know and see  If she needs to come in for EKG etc Understanding verb

## 2012-12-31 NOTE — Telephone Encounter (Signed)
Need heart rate and blood pressure numbers May still be running fast Is she on blood thinner?

## 2012-12-31 NOTE — Telephone Encounter (Signed)
Call to assess pt's symptoms

## 2013-01-01 NOTE — Telephone Encounter (Signed)
See last note You switched her from coumadin to xarelto Do you need ekg? You and pa talked about cardioversion

## 2013-01-01 NOTE — Telephone Encounter (Signed)
Need her to check heart rate and blood pressure at home with cuff If she can hang in there for several more weeks, would not need TEE Potentially could use amiodarone at that time to convert If that did not work, could do cardioversion Would put her on the schedule for 3 weeks from now  If she is miserable, TEE cardioversion could be done but she needs to be on anticoagulation for as long as possible before and

## 2013-01-02 NOTE — Telephone Encounter (Signed)
Pt reports she is unsure of BP and HR Does not check this at home Does not have a machine  Mentions an episode of chest tightness on left side this am This has resolved, but says she felt same as she did last OV from the atrial fib Confirms compliance with Laureen Ochs and other medications  Appt made for 5/9 since this was first available In the mean time, I will make Dr. Mariah Milling aware of her chest tightness episode and will call her back I advised her to call 911 should she develop chest tightness that does not resolve/symptoms get worse Understanding verb

## 2013-01-03 NOTE — Telephone Encounter (Signed)
Would monitor chest pain for now Minimal CAD on cath 2011.  Uncertain what this could be from.  Aiming for couple more weeks on xarelto  then try to get out of atrial fib

## 2013-01-03 NOTE — Telephone Encounter (Signed)
Pt informed Understanding verb 

## 2013-01-06 ENCOUNTER — Encounter: Payer: Self-pay | Admitting: *Deleted

## 2013-01-10 ENCOUNTER — Encounter: Payer: Self-pay | Admitting: Cardiovascular Disease

## 2013-01-10 ENCOUNTER — Ambulatory Visit (INDEPENDENT_AMBULATORY_CARE_PROVIDER_SITE_OTHER): Payer: Medicare Other | Admitting: Cardiovascular Disease

## 2013-01-10 ENCOUNTER — Encounter (INDEPENDENT_AMBULATORY_CARE_PROVIDER_SITE_OTHER): Payer: Medicare Other

## 2013-01-10 VITALS — BP 104/88 | HR 60 | Ht 65.0 in | Wt 253.2 lb

## 2013-01-10 DIAGNOSIS — R079 Chest pain, unspecified: Secondary | ICD-10-CM

## 2013-01-10 DIAGNOSIS — E119 Type 2 diabetes mellitus without complications: Secondary | ICD-10-CM | POA: Insufficient documentation

## 2013-01-10 DIAGNOSIS — Z7901 Long term (current) use of anticoagulants: Secondary | ICD-10-CM

## 2013-01-10 DIAGNOSIS — E785 Hyperlipidemia, unspecified: Secondary | ICD-10-CM

## 2013-01-10 DIAGNOSIS — I4891 Unspecified atrial fibrillation: Secondary | ICD-10-CM

## 2013-01-10 DIAGNOSIS — I10 Essential (primary) hypertension: Secondary | ICD-10-CM

## 2013-01-10 MED ORDER — WARFARIN SODIUM 5 MG PO TABS: 7.5000 mg | ORAL_TABLET | Freq: Every day | ORAL | Status: DC

## 2013-01-10 NOTE — Assessment & Plan Note (Signed)
Diabetes numbers show hemoglobin A1c 11. This was obtained after she had left the clinic. She is on metformin twice a day. I suspect there significant dietary noncompliance.

## 2013-01-10 NOTE — Patient Instructions (Addendum)
You are doing well. Please take metoprolol succinate one a day  Restart warfarin daily INR check in two weeks   Please call us if you have new issues that need to be addressed before your next appt.  Your physician wants you to follow-up in: 6 months.  You will receive a reminder letter in the mail two months in advance. If you don't receive a letter, please call our office to schedule the follow-up appointment.

## 2013-01-10 NOTE — Assessment & Plan Note (Signed)
Interrogation of her ICD shows one episode of atrial fibrillation around April 22 extending into April 23. This has since resolved, now back in normal sinus rhythm. There is some medication noncompliance or likely confusion. We have suggested she restart her warfarin. This will need to be closely monitored as she is likely noncompliant and subtherapeutic. We have suggested she stay on metoprolol succinate 25 mg daily. Blood pressure and heart rate are borderline low and will not advance the dose to twice a day.

## 2013-01-10 NOTE — Assessment & Plan Note (Signed)
We have encouraged her to stay on her Lipitor. 

## 2013-01-10 NOTE — Assessment & Plan Note (Signed)
Blood pressure is well controlled on today's visit. No changes made to the medications. 

## 2013-01-10 NOTE — Assessment & Plan Note (Signed)
She stopped the xarelto on her own. She did not pick up a prescription and stopped after samples. We have suggested she restart warfarin.

## 2013-01-10 NOTE — Progress Notes (Signed)
Patient ID: Lacey Lawrence, female    DOB: 01/09/1946, 67 y.o.   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  she is borderline diabetic.  She was recently seen in our office for palpitations, shortness of breath. She was found to be in atrial fibrillation. She was started on xarelto 20 mg daily as her INR level was subtherapeutic. She use the samples but did not fill a prescription. It was recommended she increase her metoprolol succinate to 2 milligrams twice a day. She thinks she was only taking his daily. Today she feels well apart from some mild fatigue. Palpitations and shortness of breath has improved    She's not exercising  as she has chronic pain in her feet. She walks around the house and the yard. She denies any significant shortness of breath or chest pain. She does have occasional lower extremity edema.  Overall she has no new complaints. Weight has been stable.   Lab work from primary care shows hemoglobin A1c 11    Cardiac cath on 07/29/2010 showed 40% and 30% lesions in the mid LAD, 30% lesion in a small LCX.    EKG shows paced rhythm with rate 60 beats per minute    Outpatient Encounter Prescriptions as of 01/10/2013  Medication Sig Dispense Refill  . aspirin 81 MG EC tablet Take 81 mg by mouth daily.        Marland Kitchen atorvastatin (LIPITOR) 40 MG tablet Take 1 tablet (40 mg total) by mouth daily.  90 tablet  3  . gabapentin (NEURONTIN) 300 MG capsule Take 1,500 mg by mouth daily.       Marland Kitchen losartan-hydrochlorothiazide (HYZAAR) 100-12.5 MG per tablet Take 1 tablet by mouth daily.      . metFORMIN (GLUCOPHAGE) 500 MG tablet  Take 500 mg by mouth 2 (two) times daily with a meal.       . metoprolol succinate (TOPROL-XL) 25 MG 24 hr tablet Take 25 mg by mouth daily.      . nitroGLYCERIN (NITROSTAT) 0.4 MG SL tablet Place 0.4 mg under the tongue every 5 (five) minutes as needed.        . [DISCONTINUED] Rivaroxaban (XARELTO) 20 MG TABS Take 1 tablet (20 mg total) by mouth daily.       No facility-administered encounter medications on file as of 01/10/2013.    Review of Systems  Constitutional: Negative.   HENT: Negative.   Eyes: Negative.   Respiratory: Negative.   Cardiovascular: Negative.   Gastrointestinal: Negative.   Musculoskeletal: Negative.        Chronic foot pain  Skin: Negative.   Neurological: Negative.   Psychiatric/Behavioral: Negative.   All other systems reviewed and are negative.    BP 104/88  Pulse 60  Ht 5\' 5"  (1.651 m)  Wt 253 lb 4 oz (114.873 kg)  BMI 42.14 kg/m2  Physical Exam  Nursing note and vitals reviewed. Constitutional: She is oriented to person, place, and time. She appears well-developed and well-nourished.  Obese  HENT:  Head: Normocephalic.  Nose: Nose normal.  Mouth/Throat: Oropharynx is clear and moist.  Eyes: Conjunctivae are normal. Pupils are equal, round, and reactive to light.  Neck: Normal  range of motion. Neck supple. No JVD present.  Cardiovascular: Normal rate, regular rhythm, S1 normal, S2 normal, normal heart sounds and intact distal pulses.  Exam reveals no gallop and no friction rub.   No murmur heard. Pulmonary/Chest: Effort normal and breath sounds normal. No respiratory distress. She has no wheezes. She has no rales. She exhibits no tenderness.  Abdominal: Soft. Bowel sounds are normal. She exhibits no distension. There is no tenderness.  Musculoskeletal: Normal range of motion. She exhibits no edema and no tenderness.  Lymphadenopathy:    She has no cervical adenopathy.  Neurological: She is alert and oriented to person, place, and time.  Coordination normal.  Skin: Skin is warm and dry. No rash noted. No erythema.  Psychiatric: She has a normal mood and affect. Her behavior is normal. Judgment and thought content normal.    Assessment and Plan

## 2013-01-13 ENCOUNTER — Telehealth: Payer: Self-pay

## 2013-01-13 NOTE — Telephone Encounter (Signed)
Pt calling c/o "stabbing pain" in left chest Says it has been going on "all morning". She thought it would have gone away by now but it has not She has not tried SL NTG I advised her to try this and call 911 She declines going to ER and "wants to see Dr. Mariah Milling" I explained he is out of office all week She will try SL NTG x 1 and will call me back if it does not help She denies worsening sob

## 2013-01-13 NOTE — Telephone Encounter (Signed)
I called pt to see if NTG relieved CP. She says it did and she is "feeling better" She will call us back should she develop this pain again

## 2013-01-21 ENCOUNTER — Inpatient Hospital Stay: Payer: Self-pay | Admitting: Internal Medicine

## 2013-01-21 ENCOUNTER — Ambulatory Visit (INDEPENDENT_AMBULATORY_CARE_PROVIDER_SITE_OTHER): Payer: Medicare Other

## 2013-01-21 DIAGNOSIS — I4891 Unspecified atrial fibrillation: Secondary | ICD-10-CM

## 2013-01-21 DIAGNOSIS — Z7901 Long term (current) use of anticoagulants: Secondary | ICD-10-CM

## 2013-01-21 LAB — COMPREHENSIVE METABOLIC PANEL
BUN: 16 mg/dL (ref 7–18)
Calcium, Total: 9.4 mg/dL (ref 8.5–10.1)
Chloride: 103 mmol/L (ref 98–107)
Creatinine: 1.02 mg/dL (ref 0.60–1.30)
EGFR (Non-African Amer.): 56 — ABNORMAL LOW
Glucose: 243 mg/dL — ABNORMAL HIGH (ref 65–99)
Osmolality: 287 (ref 275–301)
Potassium: 3.8 mmol/L (ref 3.5–5.1)
SGOT(AST): 17 U/L (ref 15–37)
SGPT (ALT): 25 U/L (ref 12–78)
Sodium: 139 mmol/L (ref 136–145)
Total Protein: 7.4 g/dL (ref 6.4–8.2)

## 2013-01-21 LAB — LIPASE, BLOOD: Lipase: 157 U/L (ref 73–393)

## 2013-01-21 LAB — CBC
HCT: 43.5 % (ref 35.0–47.0)
MCV: 85 fL (ref 80–100)
Platelet: 249 10*3/uL (ref 150–440)
RBC: 5.1 10*6/uL (ref 3.80–5.20)
WBC: 14.7 10*3/uL — ABNORMAL HIGH (ref 3.6–11.0)

## 2013-01-21 LAB — PRO B NATRIURETIC PEPTIDE: B-Type Natriuretic Peptide: 332 pg/mL — ABNORMAL HIGH (ref 0–125)

## 2013-01-21 LAB — TROPONIN I: Troponin-I: <0.02 ng/mL

## 2013-01-21 LAB — CK TOTAL AND CKMB (NOT AT ARMC): CK-MB: 0.5 ng/mL (ref 0.5–3.6)

## 2013-01-21 LAB — PROTIME-INR
INR: 1
Prothrombin Time: 13.3 secs (ref 11.5–14.7)

## 2013-01-22 ENCOUNTER — Telehealth: Payer: Self-pay

## 2013-01-22 DIAGNOSIS — I4891 Unspecified atrial fibrillation: Secondary | ICD-10-CM

## 2013-01-22 LAB — MAGNESIUM: Magnesium: 1.7 mg/dL — ABNORMAL LOW

## 2013-01-22 LAB — CK TOTAL AND CKMB (NOT AT ARMC): CK, Total: 36 U/L (ref 21–215)

## 2013-01-22 LAB — TROPONIN I: Troponin-I: <0.02 ng/mL

## 2013-01-22 NOTE — Telephone Encounter (Signed)
D/c 5/21 Will call TCM #1 5/22

## 2013-01-22 NOTE — Telephone Encounter (Signed)
TCM  

## 2013-01-22 NOTE — Telephone Encounter (Signed)
Message copied by Marcelle Overlie on Wed Jan 22, 2013 10:47 AM ------      Message from: Sandre Kitty F      Created: Wed Jan 22, 2013 10:34 AM      Regarding: tcm/ph       Scheduled w tony 6/4 at 11:30 ------

## 2013-01-23 NOTE — Telephone Encounter (Signed)
TCM #1 attempt lmtcb

## 2013-01-24 NOTE — Telephone Encounter (Signed)
Patient contacted regarding discharge from [facility] on 01/22/13.  Patient understands to follow up with provider PA on 02/05/13 at 1130 at [place] office. Patient understands discharge instructions? yes Patient understands medications and regiment? yes Patient understands to bring all medications to this visit? yes  Pt denies sob, palpitations, tachycardia, CP Confirms appt with coumadin clinic 5/28 since starting amiodarone Will call us sooner than 5/28 with any questions/concerns

## 2013-01-28 ENCOUNTER — Encounter: Payer: Self-pay | Admitting: *Deleted

## 2013-01-29 ENCOUNTER — Ambulatory Visit (INDEPENDENT_AMBULATORY_CARE_PROVIDER_SITE_OTHER): Payer: Medicare Other

## 2013-01-29 DIAGNOSIS — I4891 Unspecified atrial fibrillation: Secondary | ICD-10-CM

## 2013-01-29 DIAGNOSIS — Z7901 Long term (current) use of anticoagulants: Secondary | ICD-10-CM

## 2013-01-29 LAB — POCT INR: INR: 1.8

## 2013-02-05 ENCOUNTER — Encounter: Payer: Self-pay | Admitting: Physician Assistant

## 2013-02-05 ENCOUNTER — Telehealth: Payer: Self-pay

## 2013-02-05 ENCOUNTER — Ambulatory Visit (INDEPENDENT_AMBULATORY_CARE_PROVIDER_SITE_OTHER): Payer: Medicare Other

## 2013-02-05 ENCOUNTER — Ambulatory Visit (INDEPENDENT_AMBULATORY_CARE_PROVIDER_SITE_OTHER): Payer: Medicare Other | Admitting: Physician Assistant

## 2013-02-05 VITALS — BP 120/78 | HR 60 | Ht 65.0 in | Wt 255.0 lb

## 2013-02-05 DIAGNOSIS — I4891 Unspecified atrial fibrillation: Secondary | ICD-10-CM

## 2013-02-05 DIAGNOSIS — E119 Type 2 diabetes mellitus without complications: Secondary | ICD-10-CM

## 2013-02-05 DIAGNOSIS — Z7901 Long term (current) use of anticoagulants: Secondary | ICD-10-CM

## 2013-02-05 DIAGNOSIS — E785 Hyperlipidemia, unspecified: Secondary | ICD-10-CM

## 2013-02-05 DIAGNOSIS — R079 Chest pain, unspecified: Secondary | ICD-10-CM | POA: Insufficient documentation

## 2013-02-05 DIAGNOSIS — I10 Essential (primary) hypertension: Secondary | ICD-10-CM

## 2013-02-05 LAB — POCT INR: INR: 2.6

## 2013-02-05 MED ORDER — NITROGLYCERIN 0.4 MG SL SUBL: 0.4000 mg | SUBLINGUAL_TABLET | SUBLINGUAL | Status: AC | PRN

## 2013-02-05 NOTE — Assessment & Plan Note (Signed)
The patient had an isolated episodes of left-sided chest pain last month which improved shortly after onset. She did take NTG with minimal relief. There have been no further episodes. No association with exertion. No change in functional capacity. No SOB or DOE. NTG will be refilled, and will continue to monitor. The patient does have a great deal of anxiety which may be contributing. Prior cath in 2011 was nonbstructive. If continues, she will need repeat stress testing.

## 2013-02-05 NOTE — Assessment & Plan Note (Signed)
-  Continue Lipitor °

## 2013-02-05 NOTE — Assessment & Plan Note (Addendum)
Very well-controlled today. Continue current antihypertensives.

## 2013-02-05 NOTE — Telephone Encounter (Signed)
Message copied by [facility], Neveyah Garzon E on Wed Feb 05, 2013  3:31 PM ------      Message from: Odella Aquas A      Created: Wed Feb 05, 2013  2:55 PM       Just saw on the patient's med list- amiodarone 400mg  BID. She seems to think that she is still taking this when I saw her today. Her last office note shows that she was not on this any longer. Can we call her to see for sure? If she is, will need to reduce the dose. If not, I would leave her off, and continue Toprol-XL. Thanks! ------

## 2013-02-05 NOTE — Assessment & Plan Note (Signed)
Feels well today. Denies palpitations, lightheadedness or SOB/DOE. EKG reveals an A-paced rhythm. Current medication list includes amiodarone 400mg  PO BID. This was not on her medication list at last follow-up. She seems to think she is still taking this but is unsure. We will give her a call to confirm. If she is still taking this, it would need to be reduced to a maintenance dose. If not, she is maintaining NSR/paced rhythm, and would leave on Toprol-XL. INR therapeutic today. She will continue Coumadin and follow-up as scheduled in the Coumadin clinic. We will see her back in 3 months.

## 2013-02-05 NOTE — Telephone Encounter (Signed)
See below

## 2013-02-05 NOTE — Progress Notes (Signed)
Patient ID: Lacey Lawrence, female   DOB: [birth year], 67 y.o.   MRN: [id number]            Date:  02/05/2013   ID:  Lacey Lawrence, DOB 1946-08-12, MRN [id number]  PCP:  Bari Edward, MD  Primary Cardiologist:  Concha Se, MD Primary Electrophysiologist: Odessa Fleming, MD   History of Present Illness: Lacey Lawrence is a 67 y.o. female with a past medical history s/f VF arrest (nonobstructive CAD by cath; s/p Medtronic dual-chamber ICD), PAF, DM2 with diabetic neuropathy, HLD, HTN, anxiety and medication noncompliance who presents today for follow-up.   She was admitted to the hospital on 07/24/2010 with acute respiratory failure, cardiogenic shock, idiopathic VF with CPR in the field, acute renal failure, sepsis and aspiration pneumonia who was extubated November 23 and discharged from the hospital November 29, s/p ICD.   Cardiac catheterization 07/2010 demonstrated 30-40% mild LAD, 30% in small LCx, preserved EF, however elevated LVEDP.   Recent Hgb A1C 11%.   Last device interrogation 09/2012 showed normal functionality.   Paroxysmal atrial fibrillation has recently been identified after complaints of shortness of breath and palpitations. An attempt to transition from Coumadin to Xarelto was made given subtherapeutic INRs. The patient used the samples in the office, but did not fill the prescription. She was restarted on Coumadin. She was continued on Toprol-XL 25mg  PO daily for rate-control.   She feels well today. No palpitations, shortness of breath, DOE or lightheadedness. She had called in last month c/o sharp, "stabbing pain" in her left chest. This had relieved with NTG. No further chest pain, exertional or otherwise. No ICD shocks. She does note a generalized fatigue.   She has run out of NTG SL PRN.  INR 2.6 today  EKG: A-paced, 60 bpm, deep Qs V1-V4, III, aVF  Wt Readings from Last 3 Encounters:  02/05/13 115.667 kg (255 lb)  01/10/13 114.873 kg (253 lb 4 oz)    12/25/12 115.214 kg (254 lb)     Past Medical History  Diagnosis Date  . Hypertension   . Diabetic neuropathy   . Other primary cardiomyopathies   . Atrial fibrillation-paroxysmal     up to  5 hour episodes detected on her ICD  . Cardiac arrest -aborted   . Dual ICD-Medtronic   . Diabetes mellitus   . Other and unspecified hyperlipidemia   . Memory loss   . Acute pharyngitis   . Coronary atherosclerosis of unspecified type of vessel, native or graft   . Allergic rhinitis due to other allergen   . Dysfunction of eustachian tube   . Neuropathy     Current Outpatient Prescriptions  Medication Sig Dispense Refill          . aspirin 81 MG EC tablet Take 81 mg by mouth daily.        Marland Kitchen atorvastatin (LIPITOR) 40 MG tablet Take 1 tablet (40 mg total) by mouth daily.  90 tablet  3  . gabapentin (NEURONTIN) 300 MG capsule Take 1,500 mg by mouth daily.       Marland Kitchen losartan-hydrochlorothiazide (HYZAAR) 100-12.5 MG per tablet Take 1 tablet by mouth daily.      . metFORMIN (GLUCOPHAGE) 500 MG tablet Take 500 mg by mouth 2 (two) times daily with a meal.       . metoprolol succinate (TOPROL-XL) 25 MG 24 hr tablet Take 25 mg by mouth daily.      . nitroGLYCERIN (NITROSTAT) 0.4 MG SL tablet Place  1 tablet (0.4 mg total) under the tongue every 5 (five) minutes as needed.  25 tablet  1  . warfarin (COUMADIN) 5 MG tablet Take 1.5 tablets (7.5 mg total) by mouth daily.  45 tablet  0   Question of continued amiodarone use. Amiodarone 400mg  PO BID not on prior med list on last office note. Patient does not recall if she is taking this or not. See A&P below.   No current facility-administered medications for this visit.    Allergies:    Allergies  Allergen Reactions  . Codeine   . Lidocaine   . Lisinopril     cough    Social History:  The patient  reports that she has never smoked. She has never used smokeless tobacco. She reports that she does not drink alcohol or use illicit drugs.   ROS:   Please see the history of present illness. All other systems reviewed and negative.   PHYSICAL EXAM: VS:  BP 120/78  Pulse 60  Ht 5\' 5"  (1.651 m)  Wt 115.667 kg (255 lb)  BMI 42.43 kg/m2  Well nourished, well developed, in no acute distress HEENT: normal Neck: no JVD Cardiac: normal S1, S2; RRR; no murmur Lungs:  clear to auscultation bilaterally, no wheezing, rhonchi or rales Abd: soft, nontender, no hepatomegaly Ext: no edema Skin: warm and dry Neuro:  CNs 2-12 intact, no focal abnormalities noted

## 2013-02-05 NOTE — Patient Instructions (Addendum)
Your physician wants you to follow-up in: 3 months with Dr. Mariah Milling. You will receive a reminder letter in the mail two months in advance. If you don't receive a letter, please call our office to schedule the follow-up appointment.  Follow up with primary care doctor.

## 2013-02-05 NOTE — Telephone Encounter (Signed)
I spoke with pt and had her get bottles of meds and read to me what she is taking She confirms she IS taking amiodarone 400 mg daily, as prescribed at hospital d/c Will forward to PA

## 2013-02-05 NOTE — Assessment & Plan Note (Signed)
Recent Hgb A1C 11% per office note last month. She likely needs to be started on insulin. Advised that she follow-up with her PCP for further work-up and management. She certainly needs adequate glycemic control to avoid future complications/comorbidities. Neuropathy has already developed for which she takes gabapentin. Continue oral hypoglycemics for now.

## 2013-02-06 NOTE — Telephone Encounter (Signed)
Plan at discharge was for 400mg  qAM for maintenance dosing. This was outlined by Dr. Mariah Milling just prior to her discharge at [facility]. That's the correct dose as long as she's not taking it twice a day. Thanks for double checking.

## 2013-02-19 ENCOUNTER — Ambulatory Visit (INDEPENDENT_AMBULATORY_CARE_PROVIDER_SITE_OTHER): Payer: Medicare Other

## 2013-02-19 DIAGNOSIS — Z7901 Long term (current) use of anticoagulants: Secondary | ICD-10-CM

## 2013-02-19 DIAGNOSIS — I4891 Unspecified atrial fibrillation: Secondary | ICD-10-CM

## 2013-02-20 ENCOUNTER — Other Ambulatory Visit: Payer: Self-pay

## 2013-02-20 MED ORDER — AMIODARONE HCL 400 MG PO TABS: 400.0000 mg | ORAL_TABLET | Freq: Every day | ORAL | Status: DC

## 2013-03-03 ENCOUNTER — Other Ambulatory Visit: Payer: Self-pay

## 2013-03-03 MED ORDER — WARFARIN SODIUM 5 MG PO TABS: 7.5000 mg | ORAL_TABLET | Freq: Every day | ORAL | Status: DC

## 2013-03-03 NOTE — Telephone Encounter (Signed)
Would like to be called in today

## 2013-03-03 NOTE — Telephone Encounter (Signed)
Please review and refill

## 2013-03-05 ENCOUNTER — Ambulatory Visit (INDEPENDENT_AMBULATORY_CARE_PROVIDER_SITE_OTHER): Payer: Medicare Other | Admitting: *Deleted

## 2013-03-05 DIAGNOSIS — Z7901 Long term (current) use of anticoagulants: Secondary | ICD-10-CM

## 2013-03-05 DIAGNOSIS — I4891 Unspecified atrial fibrillation: Secondary | ICD-10-CM

## 2013-03-12 ENCOUNTER — Ambulatory Visit (INDEPENDENT_AMBULATORY_CARE_PROVIDER_SITE_OTHER): Payer: Medicare Other

## 2013-03-12 DIAGNOSIS — Z7901 Long term (current) use of anticoagulants: Secondary | ICD-10-CM

## 2013-03-12 DIAGNOSIS — I4891 Unspecified atrial fibrillation: Secondary | ICD-10-CM

## 2013-03-12 LAB — POCT INR: INR: 2.7

## 2013-04-09 ENCOUNTER — Other Ambulatory Visit: Payer: Self-pay

## 2013-04-23 ENCOUNTER — Ambulatory Visit (INDEPENDENT_AMBULATORY_CARE_PROVIDER_SITE_OTHER): Payer: Medicare Other | Admitting: *Deleted

## 2013-04-23 DIAGNOSIS — I4891 Unspecified atrial fibrillation: Secondary | ICD-10-CM

## 2013-04-23 DIAGNOSIS — Z7901 Long term (current) use of anticoagulants: Secondary | ICD-10-CM

## 2013-05-07 ENCOUNTER — Ambulatory Visit (INDEPENDENT_AMBULATORY_CARE_PROVIDER_SITE_OTHER): Payer: Medicare Other | Admitting: General Practice

## 2013-05-07 DIAGNOSIS — Z7901 Long term (current) use of anticoagulants: Secondary | ICD-10-CM

## 2013-05-07 DIAGNOSIS — I4891 Unspecified atrial fibrillation: Secondary | ICD-10-CM

## 2013-05-13 ENCOUNTER — Encounter: Payer: Self-pay | Admitting: Cardiovascular Disease

## 2013-05-13 ENCOUNTER — Ambulatory Visit (INDEPENDENT_AMBULATORY_CARE_PROVIDER_SITE_OTHER): Payer: Medicare Other | Admitting: Cardiovascular Disease

## 2013-05-13 VITALS — BP 130/90 | HR 67 | Ht 65.0 in | Wt 264.2 lb

## 2013-05-13 DIAGNOSIS — I4891 Unspecified atrial fibrillation: Secondary | ICD-10-CM

## 2013-05-13 DIAGNOSIS — I10 Essential (primary) hypertension: Secondary | ICD-10-CM

## 2013-05-13 DIAGNOSIS — R609 Edema, unspecified: Secondary | ICD-10-CM

## 2013-05-13 DIAGNOSIS — R600 Localized edema: Secondary | ICD-10-CM

## 2013-05-13 DIAGNOSIS — Z7901 Long term (current) use of anticoagulants: Secondary | ICD-10-CM

## 2013-05-13 MED ORDER — POTASSIUM CHLORIDE ER 10 MEQ PO TBCR: 10.0000 meq | EXTENDED_RELEASE_TABLET | Freq: Two times a day (BID) | ORAL | Status: DC | PRN

## 2013-05-13 MED ORDER — FUROSEMIDE 20 MG PO TABS: 20.0000 mg | ORAL_TABLET | Freq: Two times a day (BID) | ORAL | Status: DC | PRN

## 2013-05-13 MED ORDER — AMIODARONE HCL 200 MG PO TABS: 200.0000 mg | ORAL_TABLET | Freq: Every day | ORAL | Status: DC

## 2013-05-13 NOTE — Assessment & Plan Note (Signed)
Appears to be maintaining normal sinus rhythm. We'll continue on metoprolol and amiodarone We'll add Lasix for edema

## 2013-05-13 NOTE — Assessment & Plan Note (Signed)
Doing well on her warfarin. No recent bleeding

## 2013-05-13 NOTE — Assessment & Plan Note (Signed)
Blood pressure is well controlled on today's visit. No changes made to the medications. 

## 2013-05-13 NOTE — Assessment & Plan Note (Signed)
Likely secondary to diastolic CHF, component of dependent edema. We have suggested she take Lasix with potassium when necessary for worsening edema

## 2013-05-13 NOTE — Patient Instructions (Addendum)
You are doing well.  Please decrease the amiodarone down to 200 mg daily Please take lasix as needed for leg swelling When you take lasix, take a potassium  Please call us if you have new issues that need to be addressed before your next appt.  Your physician wants you to follow-up in: 3 months.  You will receive a reminder letter in the mail two months in advance. If you don't receive a letter, please call our office to schedule the follow-up appointment.

## 2013-05-13 NOTE — Progress Notes (Signed)
Patient ID: Lacey Lawrence, female    DOB: Jan 06, 1946, 67 y.o.   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  she is borderline diabetic.  Previously seen in our office for palpitations,  found to be in atrial fibrillation.  started on xarelto 20 mg daily as her INR level was subtherapeutic. She use the samples but did not fill a prescription. Metoprolol dosing was increased. Palpitations and shortness of breath improved    She's not exercising  as she has chronic pain in her feet. She walks around the house and the yard. She denies any significant shortness of breath or chest pain. On today's visit, she reports having worsening lower extremity edema Weight is up 10 pounds from her prior clinic visit  Lab work from primary care shows hemoglobin A1c 11    Cardiac cath on 07/29/2010 showed 40% and 30% lesions in the mid LAD, 30% lesion in a small LCX.    EKG shows paced rhythm with rate 67 beats per minute    Outpatient Encounter Prescriptions as of 05/13/2013  Medication Sig Dispense Refill  . amiodarone (PACERONE) 400 MG tablet Take 1 tablet (400 mg total) by mouth daily.  30 tablet  3  . aspirin 81 MG EC tablet Take 81 mg by mouth daily.        Marland Kitchen atorvastatin (LIPITOR) 40 MG tablet Take 1 tablet (40 mg total) by mouth daily.  90 tablet  3  . gabapentin (NEURONTIN) 300 MG capsule Take 1,500 mg by mouth daily.       Marland Kitchen losartan-hydrochlorothiazide (HYZAAR) 100-12.5 MG per tablet Take 1 tablet by mouth daily.      . metFORMIN (GLUCOPHAGE) 500 MG tablet Take 500 mg by mouth 2 (two) times daily with a meal.       . metoprolol succinate  (TOPROL-XL) 25 MG 24 hr tablet Take 25 mg by mouth daily.      . nitroGLYCERIN (NITROSTAT) 0.4 MG SL tablet Place 1 tablet (0.4 mg total) under the tongue every 5 (five) minutes as needed.  25 tablet  1  . warfarin (COUMADIN) 5 MG tablet Take 1.5 tablets (7.5 mg total) by mouth daily.  45 tablet  3   No facility-administered encounter medications on file as of 05/13/2013.    Review of Systems  Constitutional: Negative.   HENT: Negative.   Eyes: Negative.   Respiratory: Negative.   Cardiovascular: Positive for leg swelling.  Gastrointestinal: Negative.   Musculoskeletal: Negative.        Chronic foot pain  Skin: Negative.   Neurological: Negative.   Psychiatric/Behavioral: Negative.   All other systems reviewed and are negative.    BP 130/90  Pulse 67  Ht 5\' 5"  (1.651 m)  Wt 264 lb 4 oz (119.863 kg)  BMI 43.97 kg/m2  Physical Exam  Nursing note and vitals reviewed. Constitutional: She is oriented to person, place, and time. She appears well-developed and well-nourished.  Obese  HENT:  Head: Normocephalic.  Nose: Nose normal.  Mouth/Throat: Oropharynx is clear and moist.  Eyes: Conjunctivae are normal. Pupils are equal, round, and reactive to light.  Neck: Normal range of motion. Neck supple. No JVD present.  Cardiovascular: Normal rate, regular rhythm, S1 normal, S2 normal, normal heart sounds and intact distal pulses.  Exam reveals no gallop and no friction rub.   No murmur heard. Pulmonary/Chest: Effort normal and breath sounds normal. No respiratory distress. She has no wheezes. She has no rales. She exhibits no tenderness.  Abdominal: Soft. Bowel sounds are normal. She exhibits no distension. There is no tenderness.  Musculoskeletal: Normal range of motion. She exhibits no edema and no tenderness.  Lymphadenopathy:    She has no cervical adenopathy.  Neurological: She is alert and oriented to person, place, and time. Coordination normal.  Skin: Skin is warm and dry. No  rash noted. No erythema.  Psychiatric: She has a normal mood and affect. Her behavior is normal. Judgment and thought content normal.    Assessment and Plan

## 2013-05-26 ENCOUNTER — Other Ambulatory Visit: Payer: Self-pay | Admitting: *Deleted

## 2013-05-26 MED ORDER — WARFARIN SODIUM 5 MG PO TABS: ORAL_TABLET | ORAL | Status: DC

## 2013-05-28 ENCOUNTER — Ambulatory Visit (INDEPENDENT_AMBULATORY_CARE_PROVIDER_SITE_OTHER): Payer: Medicare Other

## 2013-05-28 DIAGNOSIS — I4891 Unspecified atrial fibrillation: Secondary | ICD-10-CM

## 2013-05-28 DIAGNOSIS — Z7901 Long term (current) use of anticoagulants: Secondary | ICD-10-CM

## 2013-06-23 ENCOUNTER — Other Ambulatory Visit: Payer: Self-pay | Admitting: *Deleted

## 2013-06-23 MED ORDER — AMIODARONE HCL 200 MG PO TABS: 200.0000 mg | ORAL_TABLET | Freq: Every day | ORAL | Status: DC

## 2013-06-23 NOTE — Telephone Encounter (Signed)
Requested Prescriptions   Signed Prescriptions Disp Refills  . amiodarone (PACERONE) 200 MG tablet 30 tablet 3    Sig: Take 1 tablet (200 mg total) by mouth daily.    Authorizing Provider: GOLLAN, TIMOTHY J    Ordering User: LOPEZ, MARINA C    

## 2013-06-25 ENCOUNTER — Ambulatory Visit (INDEPENDENT_AMBULATORY_CARE_PROVIDER_SITE_OTHER): Payer: Medicare Other | Admitting: General Practice

## 2013-06-25 DIAGNOSIS — I4891 Unspecified atrial fibrillation: Secondary | ICD-10-CM

## 2013-06-25 DIAGNOSIS — Z7901 Long term (current) use of anticoagulants: Secondary | ICD-10-CM

## 2013-06-25 LAB — POCT INR: INR: 4.2

## 2013-07-07 ENCOUNTER — Ambulatory Visit (INDEPENDENT_AMBULATORY_CARE_PROVIDER_SITE_OTHER): Payer: Medicare Other | Admitting: *Deleted

## 2013-07-07 ENCOUNTER — Ambulatory Visit (INDEPENDENT_AMBULATORY_CARE_PROVIDER_SITE_OTHER): Payer: Medicare Other | Admitting: General Practice

## 2013-07-07 DIAGNOSIS — I4891 Unspecified atrial fibrillation: Secondary | ICD-10-CM

## 2013-07-07 DIAGNOSIS — I469 Cardiac arrest, cause unspecified: Secondary | ICD-10-CM

## 2013-07-07 DIAGNOSIS — I428 Other cardiomyopathies: Secondary | ICD-10-CM

## 2013-07-07 DIAGNOSIS — Z7901 Long term (current) use of anticoagulants: Secondary | ICD-10-CM

## 2013-07-07 LAB — ICD DEVICE OBSERVATION
AL THRESHOLD: 1 V
BAMS-0001: 170 {beats}/min
BATTERY VOLTAGE: 3.0869 V
DEV-0020ICD: NEGATIVE
FVT: 0
HV IMPEDENCE: 79 Ohm
PACEART VT: 0
RV LEAD AMPLITUDE: 14.875 mv
RV LEAD THRESHOLD: 1 V
TZAT-0001ATACH: 2
TZAT-0004SLOWVT: 8
TZAT-0004SLOWVT: 8
TZAT-0005SLOWVT: 88 pct
TZAT-0011SLOWVT: 10 ms
TZAT-0012ATACH: 150 ms
TZAT-0012ATACH: 150 ms
TZAT-0012FASTVT: 200 ms
TZAT-0012SLOWVT: 200 ms
TZAT-0012SLOWVT: 200 ms
TZAT-0018ATACH: NEGATIVE
TZAT-0018FASTVT: NEGATIVE
TZAT-0019FASTVT: 8 V
TZAT-0020ATACH: 1.5 ms
TZAT-0020ATACH: 1.5 ms
TZAT-0020FASTVT: 1.5 ms
TZAT-0020SLOWVT: 1.5 ms
TZAT-0020SLOWVT: 1.5 ms
TZON-0003ATACH: 350 ms
TZON-0003SLOWVT: 340 ms
TZON-0003VSLOWVT: 450 ms
TZON-0004VSLOWVT: 32
TZST-0001ATACH: 4
TZST-0001ATACH: 6
TZST-0001FASTVT: 2
TZST-0001FASTVT: 6
TZST-0001SLOWVT: 5
TZST-0002FASTVT: NEGATIVE
TZST-0002FASTVT: NEGATIVE
TZST-0003SLOWVT: 25 J
VENTRICULAR PACING ICD: 0 pct

## 2013-07-07 LAB — POCT INR: INR: 2.9

## 2013-07-07 NOTE — Progress Notes (Signed)
ICD check with ICM in office. 

## 2013-07-10 ENCOUNTER — Other Ambulatory Visit: Payer: Self-pay

## 2013-07-14 ENCOUNTER — Encounter: Payer: Self-pay | Admitting: Internal Medicine

## 2013-08-06 ENCOUNTER — Ambulatory Visit (INDEPENDENT_AMBULATORY_CARE_PROVIDER_SITE_OTHER): Payer: Medicare Other | Admitting: General Practice

## 2013-08-06 DIAGNOSIS — I4891 Unspecified atrial fibrillation: Secondary | ICD-10-CM

## 2013-08-06 DIAGNOSIS — Z7901 Long term (current) use of anticoagulants: Secondary | ICD-10-CM

## 2013-08-06 LAB — POCT INR: INR: 2.2

## 2013-08-13 ENCOUNTER — Ambulatory Visit (INDEPENDENT_AMBULATORY_CARE_PROVIDER_SITE_OTHER): Payer: Medicare Other | Admitting: Cardiovascular Disease

## 2013-08-13 ENCOUNTER — Encounter: Payer: Self-pay | Admitting: Cardiovascular Disease

## 2013-08-13 VITALS — BP 122/82 | HR 98 | Ht 65.0 in | Wt 267.2 lb

## 2013-08-13 DIAGNOSIS — Z7901 Long term (current) use of anticoagulants: Secondary | ICD-10-CM

## 2013-08-13 DIAGNOSIS — I4891 Unspecified atrial fibrillation: Secondary | ICD-10-CM

## 2013-08-13 DIAGNOSIS — E119 Type 2 diabetes mellitus without complications: Secondary | ICD-10-CM

## 2013-08-13 DIAGNOSIS — I10 Essential (primary) hypertension: Secondary | ICD-10-CM

## 2013-08-13 MED ORDER — METOPROLOL SUCCINATE ER 25 MG PO TB24: 25.0000 mg | ORAL_TABLET | Freq: Every day | ORAL | Status: DC

## 2013-08-13 MED ORDER — LOSARTAN POTASSIUM-HCTZ 100-12.5 MG PO TABS: 1.0000 | ORAL_TABLET | Freq: Every day | ORAL | Status: AC

## 2013-08-13 NOTE — Progress Notes (Signed)
Patient ID: Lacey Lawrence, female    DOB: 1946-02-09, 67 y.o.   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  she is borderline diabetic.  Previously seen in our office for palpitations,  found to be in atrial fibrillation.  started on xarelto 20 mg daily as her INR level was subtherapeutic. She use the samples but did not fill a prescription. Metoprolol dosing was increased. Palpitations and shortness of breath improved she was changed to warfarin.   she has chronic pain in her feet. She walks around the house and the yard. She denies any significant shortness of breath or chest pain.  Weight continues to be a problem. She has chronic fatigue, sleeps long hours, going to bed at 6 PM, waking at 8 AM, naps in the day   Previous  hemoglobin A1c 11, no recent lab work available    Cardiac cath on 07/29/2010 showed 40% and 30% lesions in the mid LAD, 30% lesion in a small LCX.    EKG shows paced rhythm with rate 98 beats per minute    Outpatient Encounter Prescriptions as of 08/13/2013  Medication Sig  . amiodarone (PACERONE) 200 MG tablet Take 1 tablet (200 mg total) by mouth daily.  Marland Kitchen aspirin 81 MG EC tablet Take 81 mg by mouth daily.    Marland Kitchen atorvastatin (LIPITOR) 40 MG tablet Take 1 tablet (40 mg total) by mouth daily.  . furosemide (LASIX) 20 MG tablet Take 1 tablet (20 mg total) by mouth 2 (two) times daily as needed.  . gabapentin (NEURONTIN) 300 MG capsule Take 300 mg by mouth 3 (three) times daily.   Marland Kitchen glimepiride (AMARYL) 2 MG tablet Take 2 mg by mouth daily with breakfast.   . losartan-hydrochlorothiazide (HYZAAR) 100-12.5 MG  per tablet Take 1 tablet by mouth daily.  . metFORMIN (GLUCOPHAGE) 500 MG tablet Take 500 mg by mouth 2 (two) times daily with a meal.   . metoprolol succinate (TOPROL-XL) 25 MG 24 hr tablet Take 25 mg by mouth daily.  . nitroGLYCERIN (NITROSTAT) 0.4 MG SL tablet Place 1 tablet (0.4 mg total) under the tongue every 5 (five) minutes as needed.  . potassium chloride (K-DUR) 10 MEQ tablet Take 1 tablet (10 mEq total) by mouth 2 (two) times daily as needed.  . warfarin (COUMADIN) 5 MG tablet TAKE AS DIRECTED BY COUMADIN CLINIC    Review of Systems  Constitutional: Negative.   HENT: Negative.   Eyes: Negative.   Respiratory: Negative.   Cardiovascular: Positive for leg swelling.  Gastrointestinal: Negative.   Endocrine: Negative.   Musculoskeletal: Negative.        Chronic foot pain  Skin: Negative.   Allergic/Immunologic: Negative.   Neurological: Negative.   Hematological: Negative.   Psychiatric/Behavioral: Negative.   All other systems reviewed and are negative.    BP 122/82  Pulse 98  Ht 5\' 5"  (1.651 m)  Wt 267 lb 4 oz (121.224 kg)  BMI 44.47 kg/m2  Physical Exam  Nursing note and vitals reviewed. Constitutional: She is oriented to person, place, and time. She appears well-developed and well-nourished.  Obese  HENT:  Head: Normocephalic.  Nose: Nose normal.  Mouth/Throat: Oropharynx is clear  and moist.  Eyes: Conjunctivae are normal. Pupils are equal, round, and reactive to light.  Neck: Normal range of motion. Neck supple. No JVD present.  Cardiovascular: Normal rate, regular rhythm, S1 normal, S2 normal, normal heart sounds and intact distal pulses.  Exam reveals no gallop and no friction rub.   No murmur heard. Pulmonary/Chest: Effort normal and breath sounds normal. No respiratory distress. She has no wheezes. She has no rales. She exhibits no tenderness.  Abdominal: Soft. Bowel sounds are normal. She exhibits no distension. There is no tenderness.  Musculoskeletal:  Normal range of motion. She exhibits no edema and no tenderness.  Lymphadenopathy:    She has no cervical adenopathy.  Neurological: She is alert and oriented to person, place, and time. Coordination normal.  Skin: Skin is warm and dry. No rash noted. No erythema.  Psychiatric: She has a normal mood and affect. Her behavior is normal. Judgment and thought content normal.    Assessment and Plan

## 2013-08-13 NOTE — Assessment & Plan Note (Signed)
Currently on warfarin, paced rhythm

## 2013-08-13 NOTE — Assessment & Plan Note (Signed)
Blood pressure is well controlled on today's visit. No changes made to the medications. 

## 2013-08-13 NOTE — Assessment & Plan Note (Signed)
We have encouraged continued exercise, careful diet management in an effort to lose weight. 

## 2013-08-13 NOTE — Patient Instructions (Signed)
You are doing well. No medication changes were made.  Please call us if you have new issues that need to be addressed before your next appt.  Your physician wants you to follow-up in: 6 months.  You will receive a reminder letter in the mail two months in advance. If you don't receive a letter, please call our office to schedule the follow-up appointment.   

## 2013-08-13 NOTE — Assessment & Plan Note (Signed)
Tolerating warfarin. No signs of bleeding

## 2013-09-03 ENCOUNTER — Ambulatory Visit (INDEPENDENT_AMBULATORY_CARE_PROVIDER_SITE_OTHER): Payer: Medicare Other

## 2013-09-03 DIAGNOSIS — Z7901 Long term (current) use of anticoagulants: Secondary | ICD-10-CM

## 2013-09-03 DIAGNOSIS — I4891 Unspecified atrial fibrillation: Secondary | ICD-10-CM

## 2013-09-09 ENCOUNTER — Ambulatory Visit (INDEPENDENT_AMBULATORY_CARE_PROVIDER_SITE_OTHER): Payer: Medicare Other | Admitting: Internal Medicine

## 2013-09-09 ENCOUNTER — Encounter: Payer: Self-pay | Admitting: Internal Medicine

## 2013-09-09 VITALS — BP 118/82 | HR 64 | Ht 65.0 in | Wt 267.2 lb

## 2013-09-09 DIAGNOSIS — Z95810 Presence of automatic (implantable) cardiac defibrillator: Secondary | ICD-10-CM

## 2013-09-09 DIAGNOSIS — R600 Localized edema: Secondary | ICD-10-CM

## 2013-09-09 DIAGNOSIS — I428 Other cardiomyopathies: Secondary | ICD-10-CM

## 2013-09-09 DIAGNOSIS — Z79899 Other long term (current) drug therapy: Secondary | ICD-10-CM

## 2013-09-09 DIAGNOSIS — I4891 Unspecified atrial fibrillation: Secondary | ICD-10-CM

## 2013-09-09 DIAGNOSIS — R609 Edema, unspecified: Secondary | ICD-10-CM

## 2013-09-09 LAB — MDC_IDC_ENUM_SESS_TYPE_INCLINIC
Battery Voltage: 3.07 V
Brady Statistic AP VS Percent: 97.83 %
Brady Statistic AS VP Percent: 0 %
Brady Statistic RA Percent Paced: 97.83 %
HighPow Impedance: 83 Ohm
Lead Channel Impedance Value: 532 Ohm
Lead Channel Pacing Threshold Amplitude: 1.25 V
Lead Channel Pacing Threshold Pulse Width: 0.4 ms
Lead Channel Pacing Threshold Pulse Width: 0.4 ms
Lead Channel Sensing Intrinsic Amplitude: 1.875 mV
Lead Channel Setting Pacing Amplitude: 2 V
Lead Channel Setting Pacing Amplitude: 2.5 V
Lead Channel Setting Pacing Pulse Width: 0.6 ms
Lead Channel Setting Sensing Sensitivity: 0.3 mV
MDC IDC MSMT LEADCHNL RA PACING THRESHOLD AMPLITUDE: 0.75 V
MDC IDC MSMT LEADCHNL RV IMPEDANCE VALUE: 779 Ohm
MDC IDC MSMT LEADCHNL RV SENSING INTR AMPL: 16.375 mV
MDC IDC SESS DTM: 20150106114056
MDC IDC SET ZONE DETECTION INTERVAL: 340 ms
MDC IDC STAT BRADY AP VP PERCENT: 0 %
MDC IDC STAT BRADY AS VS PERCENT: 2.17 %
MDC IDC STAT BRADY RV PERCENT PACED: 0 %
Zone Setting Detection Interval: 300 ms
Zone Setting Detection Interval: 350 ms
Zone Setting Detection Interval: 450 ms

## 2013-09-09 MED ORDER — FUROSEMIDE 20 MG PO TABS: 20.0000 mg | ORAL_TABLET | Freq: Two times a day (BID) | ORAL | Status: DC | PRN

## 2013-09-09 MED ORDER — AMIODARONE HCL 200 MG PO TABS: 100.0000 mg | ORAL_TABLET | Freq: Every day | ORAL | Status: DC

## 2013-09-09 MED ORDER — POTASSIUM CHLORIDE ER 10 MEQ PO TBCR: 10.0000 meq | EXTENDED_RELEASE_TABLET | Freq: Two times a day (BID) | ORAL | Status: DC | PRN

## 2013-09-09 NOTE — Patient Instructions (Addendum)
Follow up with device clinic in 3 months.   Your physician has recommended you make the following change in your medication:  Decrease amiodarone to 1/2 a tablet 100 mg  Stop aspirin  Increase lasix to 2 pills every other day  Increase potassium to 2 pills in the am  Your physician recommends that you return for lab work in: 2-3 weeks Basic Metabolic Panel   Today: TSH and CMP     Your physician wants you to follow-up in: 6 months. You will receive a reminder letter in the mail two months in advance. If you don't receive a letter, please call our office to schedule the follow-up appointment.

## 2013-09-09 NOTE — Assessment & Plan Note (Signed)
Continue guideline directed medical therapy. Will stop her aspirin as she is on warfarin

## 2013-09-09 NOTE — Assessment & Plan Note (Signed)
We'll increase her diuretics and have her take her Lasix 20 mg every other day.

## 2013-09-09 NOTE — Progress Notes (Signed)
Patient Care Team: Bari Edward, MD as PCP - General (Internal Medicine)   HPI  Lacey Lawrence is a 68 y.o. female seen in followup for an ICD implantation for cardiac arrest. This occurred in November. It occurs in the setting of normal left ventricular function mild nonobstructive coronary disease and left bundle-branch block. 2011 and the is The patient denies chest pain, shortness of breath, nocturnal dyspnea, orthopnea  Previously identified orthostatic intolerance is better. She's had problems with peripheral edema. She continues on double diuretic therapy. This .  Interrogation of her device demonstrates atrial fibrillation  Thromboembolic risk factors are notable for hypertension age and gender     Past Medical History  Diagnosis Date  . Hypertension   . Diabetic neuropathy   . Other primary cardiomyopathies   . Atrial fibrillation-paroxysmal     up to  5 hour episodes detected on her ICD  . Cardiac arrest -aborted   . Dual ICD-[employer]   . Diabetes mellitus   . Other and unspecified hyperlipidemia   . Memory loss   . Acute pharyngitis   . Coronary atherosclerosis of unspecified type of vessel, native or graft   . Allergic rhinitis due to other allergen   . Dysfunction of eustachian tube   . Neuropathy     Past Surgical History  Procedure Laterality Date  . Cardiac defibrillator placement  08/01/2010    [facility], Dr. Graciela Husbands  . Knee surgery    . Tonsillectomy    . Colonoscopy    . Cataract extraction      Current Outpatient Prescriptions  Medication Sig Dispense Refill  . amiodarone (PACERONE) 200 MG tablet Take 1 tablet (200 mg total) by mouth daily.  30 tablet  3  . aspirin 81 MG EC tablet Take 81 mg by mouth daily.        Marland Kitchen atorvastatin (LIPITOR) 40 MG tablet Take 1 tablet (40 mg total) by mouth daily.  90 tablet  3  . furosemide (LASIX) 20 MG tablet Take 1 tablet (20 mg total) by mouth 2 (two) times daily as needed.  60 tablet  3  . gabapentin  (NEURONTIN) 300 MG capsule Take 300 mg by mouth 3 (three) times daily.       Marland Kitchen glimepiride (AMARYL) 2 MG tablet Take 2 mg by mouth daily with breakfast.       . losartan-hydrochlorothiazide (HYZAAR) 100-12.5 MG per tablet Take 1 tablet by mouth daily.  90 tablet  3  . metFORMIN (GLUCOPHAGE) 500 MG tablet Take 500 mg by mouth 2 (two) times daily with a meal.       . metoprolol succinate (TOPROL-XL) 25 MG 24 hr tablet Take 1 tablet (25 mg total) by mouth daily.  90 tablet  3  . nitroGLYCERIN (NITROSTAT) 0.4 MG SL tablet Place 1 tablet (0.4 mg total) under the tongue every 5 (five) minutes as needed.  25 tablet  1  . potassium chloride (K-DUR) 10 MEQ tablet Take 1 tablet (10 mEq total) by mouth 2 (two) times daily as needed.  60 tablet  3  . warfarin (COUMADIN) 5 MG tablet TAKE AS DIRECTED BY COUMADIN CLINIC  45 tablet  3   No current facility-administered medications for this visit.    Allergies  Allergen Reactions  . Codeine   . Lidocaine   . Lisinopril     cough    Review of Systems negative except from HPI and PMH  Physical Exam BP 118/82  Pulse 64  Ht 5'  5" (1.651 m)  Wt 267 lb 4 oz (121.224 kg)  BMI 44.47 kg/m2 Well developed and well nourished in no acute distress HENT normal E scleral and icterus clear Neck Supple JVP flat;   Device pocket well healed; without hematoma or erythema.  There is no tethering  Clear to ausculation  Regular rate and rhythm, no murmurs gallops or rub Soft   No clubbing cyanosis 1-2+ Edema Alert and oriented, grossly normal motor and sensory function Skin Warm and Dry    Assessment and  Plan

## 2013-09-09 NOTE — Assessment & Plan Note (Signed)
She continues with brief paroxysms of atrial fibrillation. Her next visit we will decrease the amiodarone from 200--100 mg a day. We'll check surveillance laboratories today.

## 2013-09-09 NOTE — Assessment & Plan Note (Signed)
The patient's device was interrogated.  The information was reviewed. No changes were made in the programming.    

## 2013-09-10 LAB — COMPREHENSIVE METABOLIC PANEL
A/G RATIO: 1.5 (ref 1.1–2.5)
ALBUMIN: 4.3 g/dL (ref 3.6–4.8)
ALK PHOS: 114 IU/L (ref 39–117)
ALT: 23 IU/L (ref 0–32)
AST: 18 IU/L (ref 0–40)
BILIRUBIN TOTAL: 0.5 mg/dL (ref 0.0–1.2)
BUN / CREAT RATIO: 17 (ref 11–26)
BUN: 21 mg/dL (ref 8–27)
CO2: 29 mmol/L (ref 18–29)
CREATININE: 1.23 mg/dL — AB (ref 0.57–1.00)
Calcium: 9.4 mg/dL (ref 8.6–10.2)
Chloride: 98 mmol/L (ref 97–108)
GFR calc non Af Amer: 45 mL/min/{1.73_m2} — ABNORMAL LOW (ref >59)
GFR, EST AFRICAN AMERICAN: 52 mL/min/{1.73_m2} — AB (ref >59)
GLOBULIN, TOTAL: 2.8 g/dL (ref 1.5–4.5)
Glucose: 167 mg/dL — ABNORMAL HIGH (ref 65–99)
Potassium: 4.2 mmol/L (ref 3.5–5.2)
Sodium: 143 mmol/L (ref 134–144)
Total Protein: 7.1 g/dL (ref 6.0–8.5)

## 2013-09-10 LAB — TSH: TSH: 1.83 u[IU]/mL (ref 0.450–4.500)

## 2013-09-23 ENCOUNTER — Ambulatory Visit (INDEPENDENT_AMBULATORY_CARE_PROVIDER_SITE_OTHER): Payer: Medicare Other | Admitting: *Deleted

## 2013-09-23 ENCOUNTER — Ambulatory Visit (INDEPENDENT_AMBULATORY_CARE_PROVIDER_SITE_OTHER): Payer: Medicare Other

## 2013-09-23 DIAGNOSIS — Z7901 Long term (current) use of anticoagulants: Secondary | ICD-10-CM

## 2013-09-23 DIAGNOSIS — I4891 Unspecified atrial fibrillation: Secondary | ICD-10-CM

## 2013-09-23 DIAGNOSIS — I428 Other cardiomyopathies: Secondary | ICD-10-CM

## 2013-09-23 LAB — POCT INR: INR: 1.2

## 2013-09-24 LAB — BASIC METABOLIC PANEL
BUN / CREAT RATIO: 13 (ref 11–26)
BUN: 15 mg/dL (ref 8–27)
CHLORIDE: 102 mmol/L (ref 97–108)
CO2: 26 mmol/L (ref 18–29)
Calcium: 9.3 mg/dL (ref 8.7–10.3)
Creatinine, Ser: 1.15 mg/dL — ABNORMAL HIGH (ref 0.57–1.00)
GFR, EST AFRICAN AMERICAN: 56 mL/min/{1.73_m2} — AB (ref >59)
GFR, EST NON AFRICAN AMERICAN: 49 mL/min/{1.73_m2} — AB (ref >59)
Glucose: 54 mg/dL — ABNORMAL LOW (ref 65–99)
POTASSIUM: 3.7 mmol/L (ref 3.5–5.2)
SODIUM: 145 mmol/L — AB (ref 134–144)

## 2013-10-02 ENCOUNTER — Other Ambulatory Visit: Payer: Self-pay | Admitting: *Deleted

## 2013-10-02 DIAGNOSIS — I4891 Unspecified atrial fibrillation: Secondary | ICD-10-CM

## 2013-10-02 DIAGNOSIS — I428 Other cardiomyopathies: Secondary | ICD-10-CM

## 2013-10-02 MED ORDER — POTASSIUM CHLORIDE ER 10 MEQ PO TBCR: EXTENDED_RELEASE_TABLET | ORAL | Status: DC

## 2013-11-18 ENCOUNTER — Other Ambulatory Visit: Payer: Self-pay | Admitting: *Deleted

## 2013-11-18 MED ORDER — WARFARIN SODIUM 5 MG PO TABS: ORAL_TABLET | ORAL | Status: DC

## 2013-11-19 ENCOUNTER — Telehealth: Payer: Self-pay

## 2013-11-19 ENCOUNTER — Ambulatory Visit (INDEPENDENT_AMBULATORY_CARE_PROVIDER_SITE_OTHER): Payer: Medicare Other

## 2013-11-19 DIAGNOSIS — Z5181 Encounter for therapeutic drug level monitoring: Secondary | ICD-10-CM

## 2013-11-19 DIAGNOSIS — I4891 Unspecified atrial fibrillation: Secondary | ICD-10-CM

## 2013-11-19 LAB — POCT INR: INR: 2.3

## 2013-11-19 NOTE — Telephone Encounter (Signed)
Mailed application for renewal of permanent disability parking placard to [place] [employer].

## 2013-11-26 ENCOUNTER — Other Ambulatory Visit: Payer: Self-pay

## 2013-11-26 MED ORDER — ATORVASTATIN CALCIUM 40 MG PO TABS: 40.0000 mg | ORAL_TABLET | Freq: Every day | ORAL | Status: DC

## 2013-12-08 ENCOUNTER — Other Ambulatory Visit: Payer: Self-pay | Admitting: *Deleted

## 2013-12-08 DIAGNOSIS — I4891 Unspecified atrial fibrillation: Secondary | ICD-10-CM

## 2013-12-08 MED ORDER — METOPROLOL SUCCINATE ER 25 MG PO TB24: 25.0000 mg | ORAL_TABLET | Freq: Every day | ORAL | Status: DC

## 2013-12-08 NOTE — Telephone Encounter (Signed)
Requested Prescriptions   Signed Prescriptions Disp Refills  . metoprolol succinate (TOPROL-XL) 25 MG 24 hr tablet 30 tablet 3    Sig: Take 1 tablet (25 mg total) by mouth daily.    Authorizing Provider: GOLLAN, TIMOTHY J    Ordering User: Sascha Palma C    

## 2013-12-09 ENCOUNTER — Ambulatory Visit (INDEPENDENT_AMBULATORY_CARE_PROVIDER_SITE_OTHER): Payer: Medicare Other | Admitting: *Deleted

## 2013-12-09 DIAGNOSIS — I428 Other cardiomyopathies: Secondary | ICD-10-CM

## 2013-12-09 LAB — MDC_IDC_ENUM_SESS_TYPE_INCLINIC
Battery Voltage: 3.07 V
Brady Statistic AP VS Percent: 92.77 %
Brady Statistic AS VS Percent: 7.23 %
Brady Statistic RA Percent Paced: 92.77 %
HighPow Impedance: 75 Ohm
Lead Channel Impedance Value: 532 Ohm
Lead Channel Pacing Threshold Amplitude: 0.5 V
Lead Channel Pacing Threshold Amplitude: 1.25 V
Lead Channel Pacing Threshold Pulse Width: 0.6 ms
Lead Channel Sensing Intrinsic Amplitude: 1 mV
Lead Channel Sensing Intrinsic Amplitude: 15 mV
Lead Channel Setting Pacing Amplitude: 2.5 V
Lead Channel Setting Pacing Pulse Width: 0.6 ms
MDC IDC MSMT LEADCHNL RA PACING THRESHOLD PULSEWIDTH: 0.4 ms
MDC IDC MSMT LEADCHNL RA SENSING INTR AMPL: 1.375 mV
MDC IDC MSMT LEADCHNL RV IMPEDANCE VALUE: 741 Ohm
MDC IDC MSMT LEADCHNL RV SENSING INTR AMPL: 12 mV
MDC IDC SESS DTM: 20150407130755
MDC IDC SET LEADCHNL RA PACING AMPLITUDE: 2 V
MDC IDC SET LEADCHNL RV SENSING SENSITIVITY: 0.3 mV
MDC IDC SET ZONE DETECTION INTERVAL: 340 ms
MDC IDC SET ZONE DETECTION INTERVAL: 450 ms
MDC IDC STAT BRADY AP VP PERCENT: 0 %
MDC IDC STAT BRADY AS VP PERCENT: 0 %
MDC IDC STAT BRADY RV PERCENT PACED: 0 %
Zone Setting Detection Interval: 300 ms
Zone Setting Detection Interval: 350 ms

## 2013-12-09 NOTE — Progress Notes (Signed)
ICD check with ICM in office. 

## 2013-12-12 ENCOUNTER — Other Ambulatory Visit: Payer: Self-pay | Admitting: Pharmacist

## 2013-12-12 ENCOUNTER — Telehealth: Payer: Self-pay

## 2013-12-12 MED ORDER — WARFARIN SODIUM 5 MG PO TABS
ORAL_TABLET | ORAL | Status: DC
Start: 2013-12-12 — End: 2014-04-08

## 2013-12-12 MED ORDER — WARFARIN SODIUM 5 MG PO TABS: ORAL_TABLET | ORAL | Status: DC

## 2013-12-12 NOTE — Telephone Encounter (Signed)
Spoke with Lacey Lawrence in the [facility] in the [place] office regarding a refill request for per Lacey Lawrence warfarin, she needs warfarin sent to [facility] in [place]. Lacey Lawrence states, "I will take care of this."

## 2013-12-12 NOTE — Telephone Encounter (Signed)
Patient wants the warfarin sent to [facility] in [place] not [facility]. Spoke with Riki Rusk regarding this refill for warfarin.

## 2013-12-19 ENCOUNTER — Encounter: Payer: Self-pay | Admitting: Internal Medicine

## 2013-12-24 ENCOUNTER — Ambulatory Visit (INDEPENDENT_AMBULATORY_CARE_PROVIDER_SITE_OTHER): Payer: Medicare Other

## 2013-12-24 ENCOUNTER — Encounter: Payer: Self-pay | Admitting: Cardiovascular Disease

## 2013-12-24 ENCOUNTER — Ambulatory Visit (INDEPENDENT_AMBULATORY_CARE_PROVIDER_SITE_OTHER): Payer: Medicare Other | Admitting: Cardiovascular Disease

## 2013-12-24 VITALS — BP 142/92 | HR 131 | Wt 274.2 lb

## 2013-12-24 DIAGNOSIS — I5031 Acute diastolic (congestive) heart failure: Secondary | ICD-10-CM | POA: Insufficient documentation

## 2013-12-24 DIAGNOSIS — Z5181 Encounter for therapeutic drug level monitoring: Secondary | ICD-10-CM

## 2013-12-24 DIAGNOSIS — R609 Edema, unspecified: Secondary | ICD-10-CM

## 2013-12-24 DIAGNOSIS — E119 Type 2 diabetes mellitus without complications: Secondary | ICD-10-CM

## 2013-12-24 DIAGNOSIS — R600 Localized edema: Secondary | ICD-10-CM

## 2013-12-24 DIAGNOSIS — R0602 Shortness of breath: Secondary | ICD-10-CM

## 2013-12-24 DIAGNOSIS — I4891 Unspecified atrial fibrillation: Secondary | ICD-10-CM

## 2013-12-24 DIAGNOSIS — I10 Essential (primary) hypertension: Secondary | ICD-10-CM

## 2013-12-24 DIAGNOSIS — I509 Heart failure, unspecified: Secondary | ICD-10-CM

## 2013-12-24 DIAGNOSIS — E785 Hyperlipidemia, unspecified: Secondary | ICD-10-CM

## 2013-12-24 LAB — POCT INR: INR: 1.4

## 2013-12-24 MED ORDER — TORSEMIDE 20 MG PO TABS: 40.0000 mg | ORAL_TABLET | Freq: Every day | ORAL | Status: DC

## 2013-12-24 NOTE — Progress Notes (Signed)
Patient ID: Lacey Lawrence, female    DOB: June 22, 1946, 68 y.o.   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  she is borderline diabetic.  Previously seen in our office for palpitations,  found to be in atrial fibrillation.  started on xarelto 20 mg daily as her INR level was subtherapeutic. She did not fill a prescription. Metoprolol dosing was increased. Palpitations and shortness of breath improved. she was changed to warfarin.   she has chronic pain in her feet. She walks around the house and the yard.  In followup today, she reports having significant foot and leg swelling over the past several weeks. She's been taking Lasix 40 mg in the morning. Primary care suggested she drink more fluids. Recent trip to [place], eating out a lot, box of canned foods including vegetables Weight continues to be a problem. She has chronic fatigue. She does not wear her compression hose. Weight is up 7 pounds Previous  hemoglobin A1c 11, no recent lab work available    Cardiac cath on 07/29/2010 showed 40% and 30% lesions in the mid LAD, 30% lesion in a small LCX.    EKG shows paced rhythm with rate 107 beats per minute    Outpatient Encounter Prescriptions as of 12/24/2013  Medication Sig  . amiodarone (PACERONE) 200 MG tablet Take 0.5 tablets (100 mg total) by mouth daily.  Marland Kitchen atorvastatin (LIPITOR) 40 MG tablet Take 1 tablet (40 mg total) by mouth daily.  . furosemide (LASIX) 20 MG tablet Take 1 tablet (20 mg total) by mouth 2 (two) times daily as needed (2 tablets every other day).  Marland Kitchen gabapentin (NEURONTIN) 300 MG capsule Take 300 mg by  mouth 3 (three) times daily.   Marland Kitchen glimepiride (AMARYL) 2 MG tablet Take 2 mg by mouth daily with breakfast.   . losartan-hydrochlorothiazide (HYZAAR) 100-12.5 MG per tablet Take 1 tablet by mouth daily.  . metFORMIN (GLUCOPHAGE) 500 MG tablet Take 1,000 mg by mouth daily with breakfast.   . metoprolol succinate (TOPROL-XL) 25 MG 24 hr tablet Take 1 tablet (25 mg total) by mouth daily.  . nitroGLYCERIN (NITROSTAT) 0.4 MG SL tablet Place 1 tablet (0.4 mg total) under the tongue every 5 (five) minutes as needed.  . potassium chloride (K-DUR) 10 MEQ tablet Take one tablet by mouth twice daily as needed/ as directed  . warfarin (COUMADIN) 5 MG tablet TAKE AS DIRECTED BY COUMADIN CLINIC    Review of Systems  Constitutional: Negative.   HENT: Negative.   Eyes: Negative.   Respiratory: Negative.   Cardiovascular: Positive for leg swelling.  Gastrointestinal: Negative.   Endocrine: Negative.   Musculoskeletal: Negative.        Chronic foot pain  Skin: Negative.   Allergic/Immunologic: Negative.   Neurological: Negative.   Hematological: Negative.   Psychiatric/Behavioral: Negative.   All other systems reviewed and are negative.   BP 142/92  Pulse 131  Wt 274 lb 4 oz (124.399 kg)  Physical Exam  Nursing note and vitals reviewed. Constitutional: She is oriented to person, place, and time. She appears well-developed and well-nourished.  Obese  HENT:  Head: Normocephalic.  Nose: Nose normal.  Mouth/Throat: Oropharynx is clear  and moist.  Eyes: Conjunctivae are normal. Pupils are equal, round, and reactive to light.  Neck: Normal range of motion. Neck supple. No JVD present.  Cardiovascular: Normal rate, regular rhythm, S1 normal, S2 normal, normal heart sounds and intact distal pulses.  Exam reveals no gallop and no friction rub.   No murmur heard. Trace pitting edema around her feet and lower extremities  Pulmonary/Chest: Effort normal and breath sounds normal. No respiratory  distress. She has no wheezes. She has no rales. She exhibits no tenderness.  Abdominal: Soft. Bowel sounds are normal. She exhibits no distension. There is no tenderness.  Musculoskeletal: Normal range of motion. She exhibits no edema and no tenderness.  Lymphadenopathy:    She has no cervical adenopathy.  Neurological: She is alert and oriented to person, place, and time. Coordination normal.  Skin: Skin is warm and dry. No rash noted. No erythema.  Psychiatric: She has a normal mood and affect. Her behavior is normal. Judgment and thought content normal.    Assessment and Plan

## 2013-12-24 NOTE — Assessment & Plan Note (Signed)
Blood pressure is well controlled on today's visit. No changes made to the medications. 

## 2013-12-24 NOTE — Assessment & Plan Note (Signed)
Maintaining normal sinus rhythm. No changes to her medications. Continue metoprolol. Continue warfarin

## 2013-12-24 NOTE — Patient Instructions (Signed)
You are doing well. Please hold the lasix Start torsemide/demedex 40 mg in the morning  Please decrease your fluid intake Once the leg swelling has improved, decrease the torsemide down to one in the AM  Come in for blood work in 2 weeks  Please call us if you have new issues that need to be addressed before your next appt.  Your physician wants you to follow-up in: 1 month.

## 2013-12-24 NOTE — Assessment & Plan Note (Signed)
Encouraged her to stay on her Lipitor 

## 2013-12-24 NOTE — Assessment & Plan Note (Signed)
Is detailed above, we will change her diuretic to torsemide, instructed her to decrease her fluid intake, change her dietary habits

## 2013-12-24 NOTE — Assessment & Plan Note (Signed)
We have encouraged continued exercise, careful diet management in an effort to lose weight. 

## 2013-12-24 NOTE — Assessment & Plan Note (Signed)
Dietary indiscretion, high fluid intake in the setting of diastolic dysfunction. Worsening leg edema over the past several weeks, worse since he traveled to [place] though symptoms started prior to that. She reports that Lasix 40 mg in the morning is not working for her. We will change her to torsemide 40 mg in the morning. We have arranged for basic metabolic panel in 2 weeks' time. Suggested she decrease the dose down to 20 mg in the morning after she has had adequate diuresis with improvement of her leg swelling. Suggested she decrease her fluid intake.

## 2014-01-05 ENCOUNTER — Other Ambulatory Visit: Payer: Medicare Other

## 2014-01-07 ENCOUNTER — Ambulatory Visit (INDEPENDENT_AMBULATORY_CARE_PROVIDER_SITE_OTHER): Payer: Medicare Other

## 2014-01-07 ENCOUNTER — Ambulatory Visit (INDEPENDENT_AMBULATORY_CARE_PROVIDER_SITE_OTHER): Payer: Medicare Other | Admitting: *Deleted

## 2014-01-07 DIAGNOSIS — R0602 Shortness of breath: Secondary | ICD-10-CM

## 2014-01-07 DIAGNOSIS — I4891 Unspecified atrial fibrillation: Secondary | ICD-10-CM

## 2014-01-07 DIAGNOSIS — Z5181 Encounter for therapeutic drug level monitoring: Secondary | ICD-10-CM

## 2014-01-07 LAB — POCT INR: INR: 3.7

## 2014-01-08 LAB — BASIC METABOLIC PANEL
BUN / CREAT RATIO: 21 (ref 11–26)
BUN: 25 mg/dL (ref 8–27)
CALCIUM: 8.7 mg/dL (ref 8.7–10.3)
CO2: 25 mmol/L (ref 18–29)
CREATININE: 1.17 mg/dL — AB (ref 0.57–1.00)
Chloride: 104 mmol/L (ref 97–108)
GFR calc Af Amer: 55 mL/min/{1.73_m2} — ABNORMAL LOW (ref >59)
GFR, EST NON AFRICAN AMERICAN: 48 mL/min/{1.73_m2} — AB (ref >59)
Glucose: 126 mg/dL — ABNORMAL HIGH (ref 65–99)
POTASSIUM: 5.5 mmol/L — AB (ref 3.5–5.2)
SODIUM: 144 mmol/L (ref 134–144)

## 2014-01-27 ENCOUNTER — Ambulatory Visit: Payer: Medicare Other | Admitting: Cardiovascular Disease

## 2014-01-28 ENCOUNTER — Ambulatory Visit (INDEPENDENT_AMBULATORY_CARE_PROVIDER_SITE_OTHER): Payer: Medicare Other

## 2014-01-28 DIAGNOSIS — Z5181 Encounter for therapeutic drug level monitoring: Secondary | ICD-10-CM

## 2014-01-28 DIAGNOSIS — I4891 Unspecified atrial fibrillation: Secondary | ICD-10-CM

## 2014-01-28 LAB — POCT INR: INR: 1.2

## 2014-02-04 ENCOUNTER — Ambulatory Visit (INDEPENDENT_AMBULATORY_CARE_PROVIDER_SITE_OTHER): Payer: Medicare Other

## 2014-02-04 ENCOUNTER — Ambulatory Visit (INDEPENDENT_AMBULATORY_CARE_PROVIDER_SITE_OTHER): Payer: Medicare Other | Admitting: Cardiovascular Disease

## 2014-02-04 ENCOUNTER — Encounter: Payer: Self-pay | Admitting: Cardiovascular Disease

## 2014-02-04 VITALS — BP 108/68 | HR 63 | Ht 65.0 in | Wt 274.5 lb

## 2014-02-04 DIAGNOSIS — E785 Hyperlipidemia, unspecified: Secondary | ICD-10-CM

## 2014-02-04 DIAGNOSIS — I5031 Acute diastolic (congestive) heart failure: Secondary | ICD-10-CM

## 2014-02-04 DIAGNOSIS — I4891 Unspecified atrial fibrillation: Secondary | ICD-10-CM

## 2014-02-04 DIAGNOSIS — I10 Essential (primary) hypertension: Secondary | ICD-10-CM

## 2014-02-04 DIAGNOSIS — E119 Type 2 diabetes mellitus without complications: Secondary | ICD-10-CM

## 2014-02-04 DIAGNOSIS — I509 Heart failure, unspecified: Secondary | ICD-10-CM

## 2014-02-04 DIAGNOSIS — Z5181 Encounter for therapeutic drug level monitoring: Secondary | ICD-10-CM

## 2014-02-04 LAB — POCT INR: INR: 2

## 2014-02-04 NOTE — Assessment & Plan Note (Signed)
Low blood pressure on today's visit, initial systolic pressure 100. 110 or less on my recheck. We'll cut her losartan HCTZ in half

## 2014-02-04 NOTE — Assessment & Plan Note (Signed)
We have encouraged continued exercise, careful diet management in an effort to lose weight. 

## 2014-02-04 NOTE — Progress Notes (Signed)
Patient ID: Lacey Lawrence, female    DOB: Feb 09, 1946, 68 y.o.   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  she is borderline diabetic.  Previously seen in our office for palpitations,  found to be in atrial fibrillation.  started on xarelto 20 mg daily as her INR level was subtherapeutic. She did not fill a prescription. Metoprolol dosing was increased. Palpitations and shortness of breath improved. she was changed to warfarin.  she has chronic pain in her feet.   In followup today, she reports that she is doing well. She denies any significant chest pain, no shortness of breath. She is active, though sleeps a lot. No significant worsening leg edema.  Previously he was eating out a lot, box of canned foods including vegetables and with this had leg swelling Weight continues to be a problem. Previously had chronic fatigue. She does not wear her compression hose. Previous  hemoglobin A1c 11, no recent lab work available    Cardiac cath on 07/29/2010 showed 40% and 30% lesions in the mid LAD, 30% lesion in a small LCX.    EKG shows paced rhythm with rate 63 beats per minute    Outpatient Encounter Prescriptions as of 02/04/2014  Medication Sig  . amiodarone (PACERONE) 200 MG tablet Take 0.5 tablets (100 mg total) by mouth daily.  Marland Kitchen atorvastatin (LIPITOR) 40 MG tablet Take 1 tablet (40 mg total) by mouth daily.  . furosemide (LASIX) 20 MG tablet Take 20 mg by mouth as directed.   . gabapentin (NEURONTIN) 300 MG capsule Take 300 mg by mouth 3 (three) times daily.   Marland Kitchen glimepiride (AMARYL) 2 MG tablet Take 2 mg by mouth daily with  breakfast.   . losartan-hydrochlorothiazide (HYZAAR) 100-12.5 MG per tablet Take 1 tablet by mouth daily.  . metFORMIN (GLUCOPHAGE) 500 MG tablet Take 1,000 mg by mouth daily with breakfast.   . metoprolol succinate (TOPROL-XL) 25 MG 24 hr tablet Take 1 tablet (25 mg total) by mouth daily.  . nitroGLYCERIN (NITROSTAT) 0.4 MG SL tablet Place 1 tablet (0.4 mg total) under the tongue every 5 (five) minutes as needed.  . potassium chloride (K-DUR) 10 MEQ tablet Take one tablet by mouth twice daily as needed/ as directed  . torsemide (DEMADEX) 20 MG tablet Take 2 tablets (40 mg total) by mouth daily.  Marland Kitchen warfarin (COUMADIN) 5 MG tablet TAKE AS DIRECTED BY COUMADIN CLINIC    Review of Systems  Constitutional: Negative.   HENT: Negative.   Eyes: Negative.   Respiratory: Negative.   Cardiovascular: Positive for leg swelling.  Gastrointestinal: Negative.   Endocrine: Negative.   Musculoskeletal: Negative.        Chronic foot pain  Skin: Negative.   Allergic/Immunologic: Negative.   Neurological: Negative.   Hematological: Negative.   Psychiatric/Behavioral: Negative.   All other systems reviewed and are negative.   BP 108/68  Pulse 63  Ht 5\' 5"  (1.651 m)  Wt 274 lb 8 oz (124.512 kg)  BMI 45.68 kg/m2  Physical Exam  Nursing note and vitals reviewed. Constitutional: She is oriented to person, place, and time. She appears well-developed and well-nourished.  Obese  HENT:  Head: Normocephalic.  Nose: Nose normal.  Mouth/Throat: Oropharynx is clear and moist.  Eyes: Conjunctivae are normal. Pupils are equal, round, and reactive to light.  Neck: Normal range of motion. Neck supple. No JVD present.  Cardiovascular: Normal rate, regular rhythm, S1 normal, S2 normal, normal heart sounds and intact distal pulses.  Exam reveals no gallop and no friction rub.   No murmur heard. Trace pitting edema around her feet and lower extremities  Pulmonary/Chest: Effort normal and breath sounds normal.  No respiratory distress. She has no wheezes. She has no rales. She exhibits no tenderness.  Abdominal: Soft. Bowel sounds are normal. She exhibits no distension. There is no tenderness.  Musculoskeletal: Normal range of motion. She exhibits no edema and no tenderness.  Lymphadenopathy:    She has no cervical adenopathy.  Neurological: She is alert and oriented to person, place, and time. Coordination normal.  Skin: Skin is warm and dry. No rash noted. No erythema.  Psychiatric: She has a normal mood and affect. Her behavior is normal. Judgment and thought content normal.    Assessment and Plan

## 2014-02-04 NOTE — Patient Instructions (Addendum)
You are doing well. Please stop the furosemide (lasix)  Continue the torsemide  Cut the losartan HCTZ in 1/2 daily  Only take one potassium a day, instead of two   Please call us if you have new issues that need to be addressed before your next appt.  Your physician wants you to follow-up in: 6 months.  You will receive a reminder letter in the mail two months in advance. If you don't receive a letter, please call our office to schedule the follow-up appointment.

## 2014-02-04 NOTE — Assessment & Plan Note (Signed)
She denies any tachycardia or arrhythmia. Appears to be maintaining normal sinus rhythm. This can be monitored to her defibrillator

## 2014-02-04 NOTE — Assessment & Plan Note (Signed)
Cholesterol is at goal on the current lipid regimen. No changes to the medications were made.  

## 2014-02-04 NOTE — Assessment & Plan Note (Signed)
Going through her medication list today, she is on torsemide 40 mg daily, Lasix, losartan/HCTZ.Marland Kitchen Uncertain why she is on 3 diuretics. We have suggested she hold the Lasix. Blood pressure is low and we have suggested she cut the losartan HCTZ in half. Continue  the torsemide

## 2014-02-18 ENCOUNTER — Ambulatory Visit (INDEPENDENT_AMBULATORY_CARE_PROVIDER_SITE_OTHER): Payer: Medicare Other

## 2014-02-18 DIAGNOSIS — I4891 Unspecified atrial fibrillation: Secondary | ICD-10-CM

## 2014-02-18 DIAGNOSIS — Z5181 Encounter for therapeutic drug level monitoring: Secondary | ICD-10-CM

## 2014-02-18 LAB — POCT INR: INR: 1.6

## 2014-02-25 ENCOUNTER — Ambulatory Visit (INDEPENDENT_AMBULATORY_CARE_PROVIDER_SITE_OTHER): Payer: Medicare Other

## 2014-02-25 ENCOUNTER — Other Ambulatory Visit: Payer: Self-pay | Admitting: *Deleted

## 2014-02-25 DIAGNOSIS — Z5181 Encounter for therapeutic drug level monitoring: Secondary | ICD-10-CM

## 2014-02-25 DIAGNOSIS — I4891 Unspecified atrial fibrillation: Secondary | ICD-10-CM

## 2014-02-25 LAB — POCT INR: INR: 2.7

## 2014-02-25 MED ORDER — METOPROLOL SUCCINATE ER 25 MG PO TB24: 25.0000 mg | ORAL_TABLET | Freq: Two times a day (BID) | ORAL | Status: DC

## 2014-02-25 NOTE — Telephone Encounter (Signed)
Requested Prescriptions   Signed Prescriptions Disp Refills  . metoprolol succinate (TOPROL-XL) 25 MG 24 hr tablet 60 tablet 3    Sig: Take 1 tablet (25 mg total) by mouth 2 (two) times daily.    Authorizing Provider: GOLLAN, TIMOTHY J    Ordering User: LOPEZ, MARINA C    

## 2014-03-16 ENCOUNTER — Ambulatory Visit (INDEPENDENT_AMBULATORY_CARE_PROVIDER_SITE_OTHER): Payer: Medicare Other

## 2014-03-16 ENCOUNTER — Telehealth: Payer: Self-pay | Admitting: Physician Assistant

## 2014-03-16 ENCOUNTER — Telehealth: Payer: Self-pay | Admitting: Cardiovascular Disease

## 2014-03-16 ENCOUNTER — Telehealth: Payer: Self-pay

## 2014-03-16 VITALS — BP 112/74 | HR 65 | Ht 68.0 in | Wt 274.0 lb

## 2014-03-16 DIAGNOSIS — R079 Chest pain, unspecified: Secondary | ICD-10-CM

## 2014-03-16 NOTE — Telephone Encounter (Signed)
Spoke w/ pt.  She c/o dull pain across her chest that is getting worse.  Denies SOB.  Reports that she is alternating b/t cold and hot. States her right arm hurts, but she believes it is from holding the phone.  BP 115/68 after 1 nitro, no change in pain after a few mins.  Pt reports she has never had this type of pain before.  Reports she got home from the grocery store and was relaxin when pain started.  BP 102/56 on recheck.  Spoke w/ Dr. Mariah Milling.  Advised pt to come to the office for an EKG.  She is agreeable and will have her husband bring her for a nurse visit.

## 2014-03-16 NOTE — Telephone Encounter (Signed)
Patient was asked to come in for EKG 2/2 to dull chest pain. Asked to review EKG. EKG with atrial pacing, 65, no st/t changes. Patient initially stated she had perioral numbness, however no slurred speech or unilateral weakness. Chest pain had resolved. Offered patient to be transported to the [facility] ED for further evaluation and treatment, she however declines this. She then states she no longer has any perioral sensation. Chest pain has resolved. She would like to go home. I again offer her the option to be transported to the ED/go to the ED and she declines. I advised her of 911/ED precautions.

## 2014-03-16 NOTE — Telephone Encounter (Signed)
Pt is having chest pains and was advised to go ER but pt is confused and did not want to go there. Please call patient back.

## 2014-03-16 NOTE — Telephone Encounter (Signed)
Chest pains for a few hours, and patient has taken medication like normal.

## 2014-03-16 NOTE — Telephone Encounter (Signed)
See previous phone note.  

## 2014-03-16 NOTE — Progress Notes (Signed)
1.) Reason for visit: Chest pain  2.) Name of MD requesting visit:  Dr. Mariah Milling  3.) H&P: Pt reports dull pain across her chest that seems be getting worse.  Denies SOB, states that she alternates b/t being sweaty and cold.  Reports her right arm hurts, but believes this is due to holding her phone.  BP 115/68 after taking 1 nitro.  130/71 after few mins.  On questioning, pt reports that she has not had any anxiety recently, that she went to the grocery store and was at home relaxing when sx started.   ROS related to problem: Pt presents to the office crying, stating that she is in pain. EKG performed.  While lying on table, pt c/o lip and face numbness.  Asked pt to smile and stick her tongue out, which she does w/ ease.  Pt states that she feels that her face is "pulling".  Advised pt that this is due to the nitro wearing off.  Offered pt cup of water and sat her up on table.  Pt's symptoms' mostly subside, but she continues to c/o pain over her pacer site, which she rubs constantly throughout visit.  Pt sched to see Gunnar Fusi in pacer clinic tomorrow, as well as coumadin check.  Had lengthy discussion w/ pt about her daily routine, eating and exercise habits.  Pt reports that she does not c/o of frequent indigestion, but does wake up occasionally to belch on sitting up.  Pt's boyfriend is present and reports that pt primarily sits at her computer most of the day playing games.  Advised pt that cardiac reasons for chest pain have been r/o, she states that she is not under any anxiety at the moment and has not done anything physical recently to potentially pull a muscle.  She does, however, admit that she is somewhat deconditioned, but she did not do anything today other than get a couple of things from the grocery store and did not go down every aisle.   Pt calmed down considerably after sitting and talking for a while.  Asked Eula Listen, PA to step in to reassure pt that she is safe to go home.  He repeated  suggestion to pt.  She will keep her appts tomorrow and will call w/ further questions or concerns.   4.) Assessment and plan per MD:  Per Alycia Rossetti, PA, pt to try to drink a carbonated drink to induce belching or try TUMS.

## 2014-03-17 ENCOUNTER — Ambulatory Visit (INDEPENDENT_AMBULATORY_CARE_PROVIDER_SITE_OTHER): Payer: Medicare Other

## 2014-03-17 ENCOUNTER — Ambulatory Visit (INDEPENDENT_AMBULATORY_CARE_PROVIDER_SITE_OTHER): Payer: Medicare Other | Admitting: *Deleted

## 2014-03-17 DIAGNOSIS — Z5181 Encounter for therapeutic drug level monitoring: Secondary | ICD-10-CM

## 2014-03-17 DIAGNOSIS — I428 Other cardiomyopathies: Secondary | ICD-10-CM

## 2014-03-17 DIAGNOSIS — I469 Cardiac arrest, cause unspecified: Secondary | ICD-10-CM

## 2014-03-17 DIAGNOSIS — I4891 Unspecified atrial fibrillation: Secondary | ICD-10-CM

## 2014-03-17 LAB — MDC_IDC_ENUM_SESS_TYPE_INCLINIC
Battery Voltage: 3.05 V
Brady Statistic AP VP Percent: 0 %
Brady Statistic AP VS Percent: 80.59 %
Brady Statistic AS VP Percent: 0 %
Brady Statistic AS VS Percent: 19.4 %
Brady Statistic RA Percent Paced: 80.59 %
Brady Statistic RV Percent Paced: 0 %
Date Time Interrogation Session: 20150714141405
HighPow Impedance: 89 Ohm
Lead Channel Impedance Value: 551 Ohm
Lead Channel Impedance Value: 836 Ohm
Lead Channel Pacing Threshold Amplitude: 0.5 V
Lead Channel Pacing Threshold Amplitude: 1 V
Lead Channel Pacing Threshold Pulse Width: 0.4 ms
Lead Channel Pacing Threshold Pulse Width: 0.6 ms
Lead Channel Sensing Intrinsic Amplitude: 0.875 mV
Lead Channel Sensing Intrinsic Amplitude: 14.125 mV
Lead Channel Sensing Intrinsic Amplitude: 15.125 mV
Lead Channel Sensing Intrinsic Amplitude: 2.125 mV
Lead Channel Setting Pacing Amplitude: 2 V
Lead Channel Setting Pacing Amplitude: 2.5 V
Lead Channel Setting Pacing Pulse Width: 0.6 ms
Lead Channel Setting Sensing Sensitivity: 0.3 mV
Zone Setting Detection Interval: 300 ms
Zone Setting Detection Interval: 340 ms
Zone Setting Detection Interval: 350 ms
Zone Setting Detection Interval: 450 ms

## 2014-03-17 LAB — POCT INR: INR: 2.1

## 2014-03-17 NOTE — Progress Notes (Signed)
ICD check with ICM in clinic.

## 2014-04-08 ENCOUNTER — Ambulatory Visit (INDEPENDENT_AMBULATORY_CARE_PROVIDER_SITE_OTHER): Payer: Medicare Other

## 2014-04-08 ENCOUNTER — Telehealth: Payer: Self-pay

## 2014-04-08 DIAGNOSIS — I4891 Unspecified atrial fibrillation: Secondary | ICD-10-CM

## 2014-04-08 DIAGNOSIS — Z5181 Encounter for therapeutic drug level monitoring: Secondary | ICD-10-CM

## 2014-04-08 LAB — POCT INR: INR: 1.6

## 2014-04-08 MED ORDER — WARFARIN SODIUM 5 MG PO TABS: ORAL_TABLET | ORAL | Status: DC

## 2014-04-08 NOTE — Telephone Encounter (Signed)
Refill done as requested 

## 2014-04-10 ENCOUNTER — Encounter: Payer: Self-pay | Admitting: Internal Medicine

## 2014-04-22 ENCOUNTER — Ambulatory Visit (INDEPENDENT_AMBULATORY_CARE_PROVIDER_SITE_OTHER): Payer: Medicare Other

## 2014-04-22 DIAGNOSIS — I4891 Unspecified atrial fibrillation: Secondary | ICD-10-CM

## 2014-04-22 DIAGNOSIS — Z5181 Encounter for therapeutic drug level monitoring: Secondary | ICD-10-CM

## 2014-04-22 LAB — POCT INR: INR: 2.7

## 2014-05-15 ENCOUNTER — Ambulatory Visit (INDEPENDENT_AMBULATORY_CARE_PROVIDER_SITE_OTHER): Payer: Medicare Other | Admitting: *Deleted

## 2014-05-15 DIAGNOSIS — I4891 Unspecified atrial fibrillation: Secondary | ICD-10-CM

## 2014-05-15 DIAGNOSIS — Z5181 Encounter for therapeutic drug level monitoring: Secondary | ICD-10-CM

## 2014-05-15 LAB — POCT INR: INR: 1.1

## 2014-05-26 ENCOUNTER — Other Ambulatory Visit: Payer: Self-pay | Admitting: *Deleted

## 2014-05-26 DIAGNOSIS — I4891 Unspecified atrial fibrillation: Secondary | ICD-10-CM

## 2014-05-26 DIAGNOSIS — I428 Other cardiomyopathies: Secondary | ICD-10-CM

## 2014-05-26 MED ORDER — POTASSIUM CHLORIDE ER 10 MEQ PO TBCR: EXTENDED_RELEASE_TABLET | ORAL | Status: DC

## 2014-05-26 NOTE — Telephone Encounter (Signed)
Requested Prescriptions   Signed Prescriptions Disp Refills  . potassium chloride (K-DUR) 10 MEQ tablet 60 tablet 3    Sig: Take one tablet by mouth twice daily as needed/ as directed    Authorizing Provider: Antonieta Iba    Ordering User: Kendrick Fries

## 2014-06-03 ENCOUNTER — Ambulatory Visit (INDEPENDENT_AMBULATORY_CARE_PROVIDER_SITE_OTHER): Payer: Medicare Other

## 2014-06-03 DIAGNOSIS — I4891 Unspecified atrial fibrillation: Secondary | ICD-10-CM

## 2014-06-03 DIAGNOSIS — Z5181 Encounter for therapeutic drug level monitoring: Secondary | ICD-10-CM

## 2014-06-03 LAB — POCT INR: INR: 2.4

## 2014-06-15 ENCOUNTER — Other Ambulatory Visit: Payer: Self-pay

## 2014-06-15 MED ORDER — AMIODARONE HCL 200 MG PO TABS: 100.0000 mg | ORAL_TABLET | Freq: Every day | ORAL | Status: AC

## 2014-06-15 NOTE — Telephone Encounter (Signed)
Refill for amiodarone.

## 2014-06-29 ENCOUNTER — Other Ambulatory Visit: Payer: Self-pay | Admitting: *Deleted

## 2014-06-29 ENCOUNTER — Telehealth: Payer: Self-pay | Admitting: *Deleted

## 2014-06-29 DIAGNOSIS — I5030 Unspecified diastolic (congestive) heart failure: Secondary | ICD-10-CM

## 2014-06-29 DIAGNOSIS — I4891 Unspecified atrial fibrillation: Secondary | ICD-10-CM

## 2014-06-29 MED ORDER — POTASSIUM CHLORIDE ER 10 MEQ PO TBCR: EXTENDED_RELEASE_TABLET | ORAL | Status: DC

## 2014-06-29 NOTE — Telephone Encounter (Signed)
Potassium refill

## 2014-07-01 ENCOUNTER — Encounter: Payer: Self-pay | Admitting: *Deleted

## 2014-07-02 ENCOUNTER — Ambulatory Visit (INDEPENDENT_AMBULATORY_CARE_PROVIDER_SITE_OTHER): Payer: Medicare Other

## 2014-07-02 DIAGNOSIS — I4891 Unspecified atrial fibrillation: Secondary | ICD-10-CM

## 2014-07-02 DIAGNOSIS — Z5181 Encounter for therapeutic drug level monitoring: Secondary | ICD-10-CM

## 2014-07-02 LAB — POCT INR: INR: 3.1

## 2014-07-06 ENCOUNTER — Telehealth: Payer: Self-pay

## 2014-07-06 NOTE — Telephone Encounter (Signed)
LMOVM for pt to return call 

## 2014-07-06 NOTE — Telephone Encounter (Signed)
Pt called called, states she received a letter regarding her device check. Pt was very upset about receiving this letter and wants to know why she was sent this letter when she has kept her appts every time. Also she is due for a device check and I need to know if it is ok to double book for [place]. Please advise.

## 2014-07-07 NOTE — Telephone Encounter (Signed)
[facility] office to call patient and schedule her with the device clinic.

## 2014-07-22 ENCOUNTER — Encounter: Payer: Self-pay | Admitting: Cardiovascular Disease

## 2014-07-22 ENCOUNTER — Encounter: Payer: Self-pay | Admitting: *Deleted

## 2014-07-24 ENCOUNTER — Telehealth: Payer: Self-pay

## 2014-07-24 NOTE — Telephone Encounter (Signed)
Dismissal Letter sent by Certified Mail 07/24/2014  Dismissal Letter returned Refused 08/12/2014  Dismissal Letter sent by [employer] 08/13/2014  Dismissal Letter returned again [id number] Someone opened up the 1st Class Mail envelope and returned the unclaimed Dismissal Letter.  Dismissal Letter sent a 2nd time by [employer] Mail 09/15/2014

## 2014-07-27 ENCOUNTER — Telehealth: Payer: Self-pay

## 2014-07-27 MED ORDER — METOPROLOL SUCCINATE ER 25 MG PO TB24: 25.0000 mg | ORAL_TABLET | Freq: Every day | ORAL | Status: DC

## 2014-07-27 MED ORDER — METOPROLOL SUCCINATE ER 25 MG PO TB24: 25.0000 mg | ORAL_TABLET | Freq: Every day | ORAL | Status: AC

## 2014-07-27 NOTE — Telephone Encounter (Signed)
Refill sent for metoprolol for a 30 tablets only with 0 refills sent to [facility].

## 2014-07-27 NOTE — Telephone Encounter (Signed)
[facility]

## 2014-08-03 ENCOUNTER — Ambulatory Visit: Payer: Medicare Other | Admitting: Cardiovascular Disease

## 2014-08-19 ENCOUNTER — Ambulatory Visit: Payer: Self-pay | Admitting: Cardiovascular Disease

## 2014-08-19 DIAGNOSIS — I4891 Unspecified atrial fibrillation: Secondary | ICD-10-CM

## 2014-08-19 DIAGNOSIS — I423 Endomyocardial (eosinophilic) disease: Secondary | ICD-10-CM | POA: Insufficient documentation

## 2014-08-19 DIAGNOSIS — Z5181 Encounter for therapeutic drug level monitoring: Secondary | ICD-10-CM

## 2014-09-09 ENCOUNTER — Other Ambulatory Visit: Payer: Self-pay | Admitting: Cardiovascular Disease

## 2014-10-08 ENCOUNTER — Encounter: Payer: Self-pay | Admitting: *Deleted

## 2014-10-08 ENCOUNTER — Telehealth: Payer: Self-pay | Admitting: Cardiovascular Disease

## 2014-10-08 NOTE — Telephone Encounter (Signed)
Ms. Godleski called today requesting assistance.  She stated "I was ugly but I was in so much pain."  She went on to explain she received a letter in November informing her she needed to find another provider. She stated she just loved this practice and wanted to see if anything could be done.  I informed her I would need to investigate the matter and get back with her.  I subsequently called the [place] office and found out that at her last visit the patient was verbally abusive and somewhat threatening towards staff and this was the reason for the letter of discharge.  The patient had also requested her records to be sent to [facility] for her to begin following with them.  I called the patient back and informed her of the reason for discharge and that at her request, we had sent records to [facility] for her to begin seeing them.  I advised that she follow up with [facility] for her medical needs and if she was in distress at any point in time to proceed to the emergency room.  The patient did not verbalize any distress other than disappointment with the answer and wanted to have details of who I spoke with.  I did not disclose any further information and advised her again to follow up with [facility].  The patient verbalized understanding and the phone call ended. / akw

## 2014-10-09 ENCOUNTER — Telehealth: Payer: Self-pay

## 2014-10-09 NOTE — Telephone Encounter (Signed)
Pt's boyfriend, Mr. Pollie Meyer, came in for ov today w/ Dr. Mariah Milling. He requested we make an appt for pt to f/u w/ another cardiologist.  Per Dr. Mariah Milling, called to sched pt to see Dr. Lady Gary @ [facility].  Receptionist states that pt saw Dr. Gwen Pounds on 08/19/14 and was set up for stress test & ECHO, but pt cancelled both of these.  Mr. Pollie Meyer provided w/ [facility] cardiology # and advised to have pt call to resched these procedures.

## 2014-10-15 ENCOUNTER — Telehealth: Payer: Self-pay | Admitting: Cardiovascular Disease

## 2014-10-15 NOTE — Telephone Encounter (Signed)
New Msg         Pt calling, states she has received letter that device hasn't been checked.   Pt does go to [place] office but states she needed to spk with [facility].  Please return pt call.

## 2014-10-15 NOTE — Telephone Encounter (Signed)
LMOVM w/ my direct #. 

## 2014-10-21 NOTE — Telephone Encounter (Signed)
Pt made appt to come to [facility] office device clinic on 10/29/14.

## 2014-10-29 ENCOUNTER — Other Ambulatory Visit: Payer: Self-pay | Admitting: Cardiovascular Disease

## 2014-10-29 ENCOUNTER — Ambulatory Visit (INDEPENDENT_AMBULATORY_CARE_PROVIDER_SITE_OTHER): Payer: Medicare Other | Admitting: *Deleted

## 2014-10-29 ENCOUNTER — Ambulatory Visit (INDEPENDENT_AMBULATORY_CARE_PROVIDER_SITE_OTHER): Payer: Medicare Other | Admitting: Cardiovascular Disease

## 2014-10-29 DIAGNOSIS — I469 Cardiac arrest, cause unspecified: Secondary | ICD-10-CM

## 2014-10-29 DIAGNOSIS — Z5181 Encounter for therapeutic drug level monitoring: Secondary | ICD-10-CM

## 2014-10-29 DIAGNOSIS — I4891 Unspecified atrial fibrillation: Secondary | ICD-10-CM | POA: Diagnosis not present

## 2014-10-29 DIAGNOSIS — I429 Cardiomyopathy, unspecified: Secondary | ICD-10-CM | POA: Diagnosis not present

## 2014-10-29 DIAGNOSIS — I428 Other cardiomyopathies: Secondary | ICD-10-CM

## 2014-10-29 LAB — MDC_IDC_ENUM_SESS_TYPE_INCLINIC
Brady Statistic AP VS Percent: 85.48 %
Brady Statistic AS VP Percent: 0 %
Brady Statistic AS VS Percent: 14.52 %
Date Time Interrogation Session: 20160225105647
HIGH POWER IMPEDANCE MEASURED VALUE: 87 Ohm
Lead Channel Impedance Value: 798 Ohm
Lead Channel Pacing Threshold Amplitude: 0.5 V
Lead Channel Pacing Threshold Amplitude: 0.75 V
Lead Channel Pacing Threshold Pulse Width: 0.6 ms
Lead Channel Sensing Intrinsic Amplitude: 1.25 mV
Lead Channel Sensing Intrinsic Amplitude: 1.625 mV
Lead Channel Sensing Intrinsic Amplitude: 15 mV
Lead Channel Setting Pacing Amplitude: 2.5 V
Lead Channel Setting Pacing Pulse Width: 0.6 ms
MDC IDC MSMT BATTERY VOLTAGE: 3.02 V
MDC IDC MSMT LEADCHNL RA IMPEDANCE VALUE: 532 Ohm
MDC IDC MSMT LEADCHNL RA PACING THRESHOLD PULSEWIDTH: 0.4 ms
MDC IDC MSMT LEADCHNL RV SENSING INTR AMPL: 12.5 mV
MDC IDC SET LEADCHNL RA PACING AMPLITUDE: 2 V
MDC IDC SET LEADCHNL RV SENSING SENSITIVITY: 0.3 mV
MDC IDC SET ZONE DETECTION INTERVAL: 340 ms
MDC IDC SET ZONE DETECTION INTERVAL: 450 ms
MDC IDC STAT BRADY AP VP PERCENT: 0 %
MDC IDC STAT BRADY RA PERCENT PACED: 85.48 %
MDC IDC STAT BRADY RV PERCENT PACED: 0 %
Zone Setting Detection Interval: 300 ms
Zone Setting Detection Interval: 350 ms

## 2014-10-29 LAB — POCT INR: INR: 1.1

## 2014-10-29 NOTE — Progress Notes (Signed)
ICD check in clinic. Normal device function. Thresholds and sensing consistent with previous device measurements. Impedance trends stable over time. 1 NSVT episode. No mode switches. Histogram distribution appropriate for patient and level of activity.  Optivol and thoracic impedance 2/12-2/21.   No changes made this session. Device programmed at appropriate safety margins. Device programmed to optimize intrinsic conduction.  Pt enrolled in remote follow-up. Plan to check device every 3 months remotely and in office annually. Patient education completed including shock plan. Alert tones/vibration demonstrated for patient.  Carelink 01/28/15.

## 2014-10-29 NOTE — Telephone Encounter (Signed)
Please review for refill, Thank you. 

## 2014-11-05 ENCOUNTER — Ambulatory Visit (INDEPENDENT_AMBULATORY_CARE_PROVIDER_SITE_OTHER): Payer: Medicare Other

## 2014-11-05 DIAGNOSIS — Z5181 Encounter for therapeutic drug level monitoring: Secondary | ICD-10-CM

## 2014-11-05 DIAGNOSIS — I4891 Unspecified atrial fibrillation: Secondary | ICD-10-CM

## 2014-11-05 LAB — POCT INR: INR: 1.9

## 2014-11-19 ENCOUNTER — Encounter: Payer: Self-pay | Admitting: Internal Medicine

## 2014-11-23 ENCOUNTER — Other Ambulatory Visit: Payer: Self-pay | Admitting: Cardiovascular Disease

## 2014-12-14 ENCOUNTER — Other Ambulatory Visit: Payer: Self-pay | Admitting: Cardiovascular Disease

## 2014-12-20 ENCOUNTER — Other Ambulatory Visit: Payer: Self-pay | Admitting: Cardiovascular Disease

## 2014-12-25 NOTE — Discharge Summary (Signed)
PATIENT NAME:  Lacey, Lawrence MR#:  [id number] DATE OF BIRTH:  1946-06-11  DATE OF ADMISSION:  01/21/2013 DATE OF DISCHARGE:  01/22/2013  CONSULTATIONS:  Cardiology consultation, Dr. Dossie Arbour.   DISCHARGE DIAGNOSES:   1.  Shortness of breath and palpitations because of atrial fibrillation with rapid ventricular response, resolved.  2.  Diabetes mellitus type 2. 3.  Neuropathy.   ALLERGIES: CODEINE, CEPHAZOLIN, LIDOCAINE AND NOVOCAINE    DISCHARGE MEDICATIONS: 1.  Aspirin 81 mg daily.  2.  Metoprolol 25 mg once daily.  3.  Metformin 500 mg once a day.  4.  Neurontin 300 mg 2 capsules in the morning and 3 capsules at night.  5.  Atorvastatin 40 mg p.o. daily.  6.  Amiodarone 400 mg p.o. q. 12 hours.  7.  Coumadin 7.5 mg daily.   DIET: Low sodium, ADA diet.   DISHCHARGE INSTRUCTIONS: The patient advised to continue amiodarone 400 mg p.o. b.i.d. for 5 days and then resume 400 mg daily after that. The patient advised to take an extra dose of metoprolol if the heart rate is up or the patient feels palpitations as per Dr. Mariah Milling.  The patient is to resume home-dose losartan as well.     HOSPITAL COURSE: The patient is a 69 year old female patient who follows up with Dr. Mariah Milling,  came in because palpitations and shortness of breath. EKG on admission showed atrial fibrillation with RVR, rate of 135 beats per minute. The patient's past medical history is significant for ventricular fibrillation with cardiac arrest in 2011, and the patient had acute respiratory failure and cardiogenic shock. The patient had ventricular fibrillation, at that time she had an ICD placed.  So anyway, she is admitted for atrial fibrillation with RVR. The patient was started on amiodarone 400 mg p.o. b.i.d. by Cardiology, Dr. Dossie Arbour; and the patient was monitored on telemetry. The patient's INR was 1 on admission, D-dimer less than 0.22. Other lab data showed chemistries within normal limits. The patient's  troponins were negative. Chest x-ray showed mild basilar opacity, likley  atelectasis, but she is not hypoxic. The patient's heart rate has been reasonably controlled on 21st with hr  63. The patient really wanted to go home and Dr. Mariah Milling agreed with that. The patient was given a prescription for amiodarone 400 mg p.o. b.i.d. for 5 days, then continue 400 mg daily. The patient initially started on Lovenox 1 mg/kg q.12 and continued on Coumadin at 10 mg, but because of interactions with amiodarone and Coumadin, we just stayed with Coumadin 7.5 mg daily that she takes at home and Dr. Mariah Milling agreed with that. The patient understands that she needs to follow up with him for INR checkup and the patient understands. She is discharged home.   TIME SPENT: About more than 30 minutes for the patient and coordinating the care.   ____________________________ Katha Hamming, MD sk:cc D: 01/23/2013 20:21:47 ET T: 01/23/2013 21:04:30 ET JOB#: [id number]  cc: Katha Hamming, MD, <Dictator> Katha Hamming MD ELECTRONICALLY SIGNED 01/30/2013 0:08

## 2014-12-25 NOTE — Consult Note (Signed)
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  she is borderline diabetic. She presents with SOB, palpitations. EKG in ER showing tachycardia with rate 135 bpm.   She was recently seen in our office for palpitations, shortness of breath. She was found to be in atrial fibrillation. She was started on xarelto 20 mg daily as her INR level was subtherapeutic. She use the samples but did not fill a prescription. It was recommended she increase her metoprolol succinate to 25 milligrams twice a day. She thinks she was only taking his daily. On higher dose metoprolol, she converted to NSR.    She's not exercising  as she has chronic pain in her feet. She walks around the house and the yard. She denies any significant shortness of breath or chest pain. She does have occasional lower extremity edema.   Overall she has no new complaints. Weight has been stable.   Lab work from primary care shows hemoglobin A1c 11     Cardiac cath on 07/29/2010 showed 40% and 30% lesions in the mid LAD, 30% lesion in a small LCX.     EKG shows paced rhythm with rate 60 beats per minute     Diabetes:    Obesity:    HTN:    defibrilator:    Tonsillectomy:    Cardiac Arrest:        Admit Diagnosis:   CHEST PAIN: Onset Date: 22-Jan-2013, Status: Active, Description: CHEST PAIN  Home Medications: Medication Instructions Status  aspirin 81 mg oral delayed release tablet 1 tab(s) orally once a day Active  metoprolol succinate 25 mg oral tablet, extended release 1 tab(s) orally once a day Active  metFORMIN 500 mg oral tablet, extended release 1 tab(s) orally once a day Active   gabapentin 300 mg oral capsule 2 cap(s) orally once a day (in the morning) and 3 capsules at bedtime Active  atorvastatin 40 mg oral tablet 1 tab(s) orally once a day (at bedtime) Active   Lab Results:  Hepatic:  20-May-14 18:41   Bilirubin, Total 0.3  Alkaline Phosphatase  143  SGPT (ALT) 25  SGOT (AST) 17  Total Protein, Serum 7.4  Albumin, Serum  3.3  Routine Chem:  20-May-14 18:41   Glucose, Serum  243  BUN 16  Creatinine (comp) 1.02  Sodium, Serum 139  Potassium, Serum 3.8  Chloride, Serum 103  CO2, Serum 30  Calcium (Total), Serum 9.4  Osmolality (calc) 287  eGFR (African American) >60  eGFR (Non-African American)  56 (eGFR values <73m/min/1.73 m2 may be an indication of chronic kidney disease (CKD). Calculated eGFR is useful in patients with stable renal function. The eGFR calculation will not be reliable in acutely ill patients when serum creatinine is changing rapidly. It is not useful in  patients on dialysis. The eGFR calculation may not be applicable to patients at the low and high extremes of body sizes, pregnant women, and vegetarians.)  Anion Gap  6  Lipase 157 (Result(s) reported on 21 Jan 2013 at 06:59PM.)  B-Type Natriuretic Peptide ([facility]  332 (Result(s) reported on 21 Jan 2013 at 07:06PM.)  Cardiac:  20-May-14 18:41   Troponin I < 0.02 (0.00-0.05 0.05 ng/mL or less: NEGATIVE  Repeat testing in 3-6 hrs  if clinically indicated. >0.05 ng/mL: POTENTIAL  MYOCARDIAL INJURY. Repeat  testing in 3-6 hrs if  clinically indicated. NOTE: An increase or decrease  of 30% or more on serial  testing suggests a  clinically important change)  CK, Total 42  CPK-MB, Serum 0.5 (Result(s) reported on 21 Jan 2013 at 07:06PM.)  Routine Coag:  20-May-14 18:41   Prothrombin 13.3  INR 1.0 (INR reference interval applies to patients on anticoagulant therapy. A single INR therapeutic range for coumarins is not optimal for all indications; however, the suggested  range for most indications is 2.0 - 3.0. Exceptions to the INR Reference Range may include: Prosthetic heart valves, acute myocardial infarction, prevention of myocardial infarction, and combinations of aspirin and anticoagulant. The need for a higher or lower target INR must be assessed individually. Reference: The Pharmacology and Management of the Vitamin K  antagonists: the seventh ACCP Conference on Antithrombotic and Thrombolytic Therapy. RJJOA.4166 Sept:126 (3suppl): [id number]. A HCT value >55% may artifactually increase the PT.  In one study,  the increase was an average of 25%. Reference:  "Effect on Routine and Special Coagulation Testing Values of Citrate Anticoagulant Adjustment in Patients with High HCT Values." American Journal of Clinical Pathology 2006;126:400-405.)  D-Dimer, Quantitative < 0.22 (If the D-dimer test is being used to assist in the exclusion of DVT and/or PE, note the following:  In various studies concerning the D-dimer methodology (STA Liatest) in use by this laboratory, it has been reported that with a cut-off value of 0.50 ug/mL FEU, the  negative predictive value regarding the exclusion of thrombosis is within the 95-100% range.  In patients with high pre-test probability of DVT/PE the results of the D-dimer test should be correlated with other diagnostic and clinical assessment modalities." Reference: [employer]., 2005.)  Routine Hem:  20-May-14 18:41   WBC (CBC)  14.7  RBC (CBC) 5.10  Hemoglobin (CBC) 14.1  Hematocrit (CBC) 43.5  Platelet Count (CBC) 249 (Result(s) reported on 21 Jan 2013 at 06:53PM.)  MCV 85  MCH 27.6  MCHC 32.4  RDW 13.9   EKG:  Interpretation EKG shows sinus tachycardia with LBBB, rate 135 bpm,   Radiology Results: XRay:    20-May-14 18:45, Chest Portable Single View  Chest Portable Single View   REASON FOR EXAM:    Chest Pain  COMMENTS:       PROCEDURE: DXR - DXR PORTABLE CHEST SINGLE VIEW  - Jan 21 2013  6:45PM     RESULT: Comparison: 10/08/2011    Findings:  Heart size is upper limits of normal, which is similar to slightly   increased from prior. Wires seen from multilead AICD. There mild right   basilar opacities which may represent atelectasis.    IMPRESSION:   Mild right basilar opacities may represent atelectasis. Other etiologies   such as infection are not excluded. Followup PA and lateral chest     radiograph is suggested.        Verified By: Gregor Hams, M.D., MD    Codeine: GI Distress  Other- Explain in Comments Line: Swelling  Cefazolin: Itching, Rash  Lidocaine: Unknown  Novocain: Unknown  Vital Signs/Nurse's Notes: **Vital Signs.:   21-May-14 07:46  Vital Signs Type Routine  Temperature Temperature (F) 98.4  Celsius 36.8  Temperature Source oral  Pulse Pulse 63  Respirations Respirations 20  Systolic BP Systolic BP 063  Diastolic BP (mmHg) Diastolic BP (mmHg) 67  Mean BP 85  Pulse Ox % Pulse Ox % 95  Pulse Ox Activity Level  At rest  Oxygen Delivery Room Air/ 21 %    Impression 69 year old woman with history of VF arrest, admitted to the hospital on July 24, 2010 with acute respiratory failure, cardiogenic shock, 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  she is borderline diabetic. She presents with SOB, palpitations. EKG in ER showing tachycardia with rate 135 bpm. concerning for atrial fibrillation  1) Atrial fibrillation lasted several hours last night had an episode april 2014 as well will start on amiodarone 400 mg po BID for 5 days then 400 mg in the AM (she forgets pm meds) --Can go home today if no more arrhythmia --outpt eval of rhythm with   Electronic Signatures: Ida Rogue (MD)  (Signed 21-May-14 08:33)  Authored: General  Aspect/Present Illness, Past Medical History, Health Issues, Home Medications, Labs, EKG , Radiology, Allergies, Vital Signs/Nurse's Notes, Impression/Plan   Last Updated: 21-May-14 08:33 by Ida Rogue (MD)

## 2014-12-25 NOTE — H&P (Signed)
PATIENT NAME:  Lacey Lawrence, Lacey Lawrence MR#:  [id number] DATE OF BIRTH:  22-Apr-1946  DATE OF ADMISSION:  01/21/2013  PRIMARY CARE PHYSICIAN: Dr. Bari Edward   CARDIOLOGIST: Dr. Mariah Milling  CHIEF COMPLAINT: Palpitations and chest pain today.   HISTORY OF PRESENT ILLNESS:  The patient, who is a 69 year old Caucasian female with history of etiopathic ventricular fibrillation, status post cardiac arrest in 2011, status post defibrillator, history of left bundle branch block per old EKG, hypertension and obesity, comes to the Emergency Room with complaints of chest tightness and palpitations. She was in sinus tach with left bundle branch block and heart rate of 135. She received aspirin, three sublingual nitroglycerin. During my evaluation her heart rate was down in the 70s, sinus rhythm. The patient's blood pressure is stable. She was recently started on Coumadin. She was recently started several weeks ago on Coumadin per Dr. Mariah Milling; however, her INR has been subtherapeutic. D-dimer is within normal limits and her first set of cardiac enzymes is negative. She is being admitted for further evaluation and management.   PAST MEDICAL HISTORY: As listed above type.  1.  Type 2 diabetes.  2.  Obesity.  3.  Hypertension.  4.  History of cardiac arrest secondary to idiopathic ventricular fibrillation status post defibrillator placement.   PAST SURGICAL HISTORY:  Placement of defibrillator and tonsillectomy.   ALLERGIES: CEFAZOLINE, CODEINE, LIDOCAINE, NOVOCAINE.   MEDICATIONS:  1.  Metoprolol 25 mg extended release daily.  2.  Metformin 500 mg daily.  3.  Gabapentin 300 mg 2 capsules in the morning and 3 capsules at bedtime.  4.  Atorvastatin 40 mg daily at bedtime.  5.  Aspirin 81 mg daily.  6.  Warfarin. The patient states she takes warfarin daily, however, does not remember the dosage.   SOCIAL HISTORY: She lives at home with her husband. Her husband denies any smoking. No alcohol or drug use.    FAMILY HISTORY: Hypertension.   REVIEW OF SYSTEMS.  CONSTITUTIONAL: No fever, fatigue, weakness.  EYES: No blurred or double vision or glaucoma.  ENT: No tinnitus, ear pain, hearing loss.  RESPIRATORY: No cough, wheeze, hemoptysis or chronic obstructive pulmonary disease.  CARDIOVASCULAR: Positive for chest pain and palpitations.   GASTROINTESTINAL: No nausea, vomiting, diarrhea, abdominal pain or GERD.  GENITOURINARY: No dysuria or hematuria.  ENDOCRINE: No polyuria, nocturia or thyroid problems.  HEMATOLOGY: No anemia or easy bruising.  SKIN: No acne or rash.  MUSCULOSKELETAL: Positive for arthritis.  NEUROLOGIC:  No CVA or transient ischemic attack.  PSYCHIATRIC: No anxiety or depression.  All other systems reviewed and negative.   PHYSICAL EXAMINATION: GENERAL: The patient is awake, alert, and oriented x3, not in acute distress.  VITAL SIGNS: Afebrile, pulse 67 to 77 regular, blood pressure is 114/87, sats are 97% on 2 liters.  HEENT: Atraumatic, normocephalic. Pupils are equal, round and reactive to light and accommodation. EOM intact. Oral mucosa is moist.  NECK: Supple. No JVD. No carotid bruit.  LUNGS: Clear to auscultation bilaterally. No rales, rhonchi, respiratory distress or labored breathing.  CARDIOVASCULAR: Both the heart sounds are normal. Rate, rhythm regular. PMI not lateralized. Chest is nontender.  EXTREMITIES: Good pedal pulses, good femoral pulses. No lower extremity edema.  ABDOMEN: Soft, benign, nontender. No organomegaly. Positive bowel sounds.  NEUROLOGIC: Grossly intact cranial nerves II through XII. No motor or sensory deficits.   PSYCHIATRIC: The patient is awake, alert, oriented x3.   DIAGNOSTIC STUDIES: EKG shows left bundle branch block wide QRS tachycardia and  left axis deviation. Cardiac enzymes, first set is negative. D-dimer is within normal limits. Lipase is 157. White count is 14.7, hemoglobin and hematocrit is 14.1 and 43.5. Comprehensive  metabolic panel within normal limits except glucose of 243. PT-INR is 13.3 and 1.0.   ASSESSMENT: A 69 year old patient with history of atrial fibrillation with history of suspected paroxysmal atrial fibrillation, history of hypertension and history of status post cardiac arrest due to idiopathic ventricular fibrillation, comes in with:  1.  Chest tightness with palpitations. She came in with wide QRS sinus tach with heart rate in the 130s and chest pain. She received aspirin, nitroglycerin, her heart rate is now in the 60s sinus rhythm. The patient will be admitted to the telemetry floor. Cardiac diet. Will monitor cardiac enzymes x3. Check magnesium. Will resume Coumadin, add Lovenox since INR is  subtherapeutic. Not sure if patient is going to be able to afford Pradaxa or Xarelto. Catheterization in 2011 per Dr. Mariah Milling showed mild coronary artery disease. She has defibrillator placed in 2011 after her cardiac graft. Continue to monitor rhythm and adjust her metoprolol if needed for tachycardia.  2.  Hypertension. Continue metoprolol.  3.  Chronic anticoagulation with subtherapeutic INR. We will to continue Coumadin, add Lovenox as the patient's INR is therapeutic.  4.  Suspect sleep apnea. The patient will benefit from outpatient work-up.  5.  Type 2 diabetes. Sliding scale insulin and metformin.  6.  Further work-up according to the patient's clinical course. Hospital admission plan was discussed with the patient and the patient's family members who are agreeable to it. The case was discussed with Dr. Mariah Milling as well.   TIME SPENT: 55 minutes    ____________________________ Jearl Klinefelter A. Allena Katz, MD sap:kb D: 01/21/2013 21:15:52 ET T: 01/21/2013 23:31:18 ET JOB#: [id number]  cc: Ori Kreiter A. Allena Katz, MD, <Dictator> Bari Edward, MD Antonieta Iba, MD Willow Ora MD ELECTRONICALLY SIGNED 01/23/2013 13:34

## 2015-01-28 ENCOUNTER — Telehealth: Payer: Self-pay | Admitting: Cardiology

## 2015-01-28 ENCOUNTER — Ambulatory Visit (INDEPENDENT_AMBULATORY_CARE_PROVIDER_SITE_OTHER): Payer: Medicare Other | Admitting: *Deleted

## 2015-01-28 DIAGNOSIS — I509 Heart failure, unspecified: Secondary | ICD-10-CM

## 2015-01-28 DIAGNOSIS — I469 Cardiac arrest, cause unspecified: Secondary | ICD-10-CM

## 2015-01-28 DIAGNOSIS — I5031 Acute diastolic (congestive) heart failure: Secondary | ICD-10-CM

## 2015-01-28 DIAGNOSIS — I428 Other cardiomyopathies: Secondary | ICD-10-CM

## 2015-01-28 NOTE — Telephone Encounter (Signed)
Spoke with pt and reminded pt of remote transmission that is due today. Pt verbalized understanding.   

## 2015-01-29 ENCOUNTER — Telehealth: Payer: Self-pay | Admitting: Cardiology

## 2015-01-29 DIAGNOSIS — I5031 Acute diastolic (congestive) heart failure: Secondary | ICD-10-CM | POA: Diagnosis not present

## 2015-01-29 DIAGNOSIS — I429 Cardiomyopathy, unspecified: Secondary | ICD-10-CM

## 2015-01-29 NOTE — Telephone Encounter (Signed)
LMOVM for pt to return call 

## 2015-01-29 NOTE — Telephone Encounter (Signed)
Follow Up  ° °Pt returned the call  °

## 2015-01-29 NOTE — Telephone Encounter (Signed)
Informed pt that I was able to get her transferred to our clinic in [facility] but unfortunately did not receive transmission. Requested that pt send transmission again. Pt verbalized understanding.

## 2015-01-29 NOTE — Progress Notes (Signed)
Remote ICD transmission.   

## 2015-02-04 LAB — CUP PACEART REMOTE DEVICE CHECK
Battery Voltage: 3 V
Brady Statistic AP VP Percent: 0 %
Brady Statistic AP VS Percent: 87.72 %
Brady Statistic AS VS Percent: 12.28 %
Date Time Interrogation Session: 20160527161402
HighPow Impedance: 81 Ohm
Lead Channel Impedance Value: 532 Ohm
Lead Channel Impedance Value: 703 Ohm
Lead Channel Sensing Intrinsic Amplitude: 0.625 mV
Lead Channel Setting Pacing Amplitude: 2 V
Lead Channel Setting Pacing Amplitude: 2.5 V
MDC IDC MSMT LEADCHNL RV SENSING INTR AMPL: 13.5 mV
MDC IDC SET LEADCHNL RV PACING PULSEWIDTH: 0.6 ms
MDC IDC SET LEADCHNL RV SENSING SENSITIVITY: 0.3 mV
MDC IDC SET ZONE DETECTION INTERVAL: 300 ms
MDC IDC SET ZONE DETECTION INTERVAL: 350 ms
MDC IDC STAT BRADY AS VP PERCENT: 0 %
MDC IDC STAT BRADY RA PERCENT PACED: 87.72 %
MDC IDC STAT BRADY RV PERCENT PACED: 0 %
Zone Setting Detection Interval: 340 ms
Zone Setting Detection Interval: 450 ms

## 2015-02-24 ENCOUNTER — Encounter: Payer: Self-pay | Admitting: Cardiology

## 2015-03-01 ENCOUNTER — Encounter: Payer: Self-pay | Admitting: Internal Medicine

## 2015-03-01 ENCOUNTER — Other Ambulatory Visit: Payer: Self-pay

## 2015-05-27 ENCOUNTER — Telehealth: Payer: Self-pay | Admitting: *Deleted

## 2015-05-27 NOTE — Telephone Encounter (Signed)
Reviewed issues with office director, Lacey Lawrence, as patient was on Dr. Odessa Fleming schedule for 10/6. Called patient to remind her of conversation in February (with Lacey Lawrence) advising/explaining of discharge from [facility].  Explained that we extended emergent device check/care, but that we would no longer be able to offer these services to her as she has had time to look for another provider.  She states that she saw Lacey Lawrence in [facility] but that they do not check devices there.  Advised her to call them back to discuss possible referral they could make for her.  Also gave patient number of independent cardiologist in [place] [facility], [facility], who may be able to check her device for her and  reminded her of L[facility] referral given to her by Lacey Lawrence in February.  She verbalized understanding of conversation.

## 2015-06-10 ENCOUNTER — Encounter: Payer: Medicare Other | Admitting: Internal Medicine

## 2015-07-12 ENCOUNTER — Encounter: Payer: Self-pay | Admitting: Internal Medicine

## 2015-07-18 ENCOUNTER — Encounter: Payer: Self-pay | Admitting: Internal Medicine

## 2015-10-25 ENCOUNTER — Emergency Department
Admission: EM | Admit: 2015-10-25 | Discharge: 2015-10-25 | Disposition: A | Discharge status: Home / Self Care | Payer: Medicare Other | Attending: Emergency Medicine | Admitting: Emergency Medicine

## 2015-10-25 ENCOUNTER — Emergency Department: Payer: Medicare Other

## 2015-10-25 ENCOUNTER — Encounter: Payer: Self-pay | Admitting: Emergency Medicine

## 2015-10-25 DIAGNOSIS — J209 Acute bronchitis, unspecified: Secondary | ICD-10-CM | POA: Diagnosis not present

## 2015-10-25 DIAGNOSIS — Z7984 Long term (current) use of oral hypoglycemic drugs: Secondary | ICD-10-CM | POA: Insufficient documentation

## 2015-10-25 DIAGNOSIS — Z79899 Other long term (current) drug therapy: Secondary | ICD-10-CM | POA: Diagnosis not present

## 2015-10-25 DIAGNOSIS — Z7901 Long term (current) use of anticoagulants: Secondary | ICD-10-CM | POA: Diagnosis not present

## 2015-10-25 DIAGNOSIS — E1140 Type 2 diabetes mellitus with diabetic neuropathy, unspecified: Secondary | ICD-10-CM | POA: Insufficient documentation

## 2015-10-25 DIAGNOSIS — R05 Cough: Secondary | ICD-10-CM | POA: Diagnosis present

## 2015-10-25 DIAGNOSIS — I10 Essential (primary) hypertension: Secondary | ICD-10-CM | POA: Insufficient documentation

## 2015-10-25 MED ORDER — ALBUTEROL SULFATE HFA 108 (90 BASE) MCG/ACT IN AERS: 2.0000 | INHALATION_SPRAY | Freq: Four times a day (QID) | RESPIRATORY_TRACT | Status: DC | PRN

## 2015-10-25 MED ORDER — ACETAMINOPHEN 325 MG PO TABS
ORAL_TABLET | ORAL | Status: AC
  Filled 2015-10-25: qty 2

## 2015-10-25 MED ORDER — ACETAMINOPHEN 325 MG PO TABS
650.0000 mg | ORAL_TABLET | Freq: Once | ORAL | Status: AC
  Administered 2015-10-25: 650 mg via ORAL

## 2015-10-25 MED ORDER — IPRATROPIUM-ALBUTEROL 0.5-2.5 (3) MG/3ML IN SOLN
3.0000 mL | Freq: Once | RESPIRATORY_TRACT | Status: AC
  Administered 2015-10-25: 3 mL via RESPIRATORY_TRACT

## 2015-10-25 MED ORDER — AZITHROMYCIN 250 MG PO TABS: ORAL_TABLET | ORAL | Status: DC

## 2015-10-25 MED ORDER — ACETAMINOPHEN 325 MG PO TABS
ORAL_TABLET | ORAL | Status: AC
  Filled 2015-10-25: qty 1

## 2015-10-25 MED ORDER — IPRATROPIUM-ALBUTEROL 0.5-2.5 (3) MG/3ML IN SOLN
RESPIRATORY_TRACT | Status: AC
  Administered 2015-10-25: 3 mL via RESPIRATORY_TRACT
  Filled 2015-10-25: qty 3

## 2015-10-25 MED ORDER — BENZONATATE 100 MG PO CAPS: 200.0000 mg | ORAL_CAPSULE | Freq: Three times a day (TID) | ORAL | Status: AC | PRN

## 2015-10-25 NOTE — ED Notes (Addendum)
Pt presents with cough for 24 hrs, some productive at time with green sputum in color. Also reports tightness in her chest even when she is not coughing.

## 2015-10-25 NOTE — ED Provider Notes (Signed)
Time Seen: Approximately *1430  I have reviewed the triage notes  Chief Complaint: Cough   History of Present Illness: Lacey Lawrence is a 70 y.o. female who presents with a persistent cough that started yesterday as being intense but she states she's had over the last couple of days. She states she's developed a headache and points mainly to the frontal region whenever she coughs. She denies any nausea, vomiting, weakness, neck pain or stiffness. She denies any fever at home. She states her cough has been productive in nature. Her husband who is with her is also had similar symptoms. She denies any hemoptysis or obvious wheezing at home. She has a history of pulmonary edema remotely in the past but denies any leg pain or edema at this time. As a left upper wall chest pacemaker that's been established. She is on chronic anticoagulation therapy and denies any changes in her dosage, etc. Denies any pulmonary emboli risk factors such as travel, etc.   Past Medical History  Diagnosis Date  . Hypertension   . Diabetic neuropathy (HCC)   . Other primary cardiomyopathies   . Atrial fibrillation-paroxysmal     up to  5 hour episodes detected on her ICD  . Cardiac arrest -aborted   . Dual ICD-Medtronic   . Diabetes mellitus   . Other and unspecified hyperlipidemia   . Memory loss   . Acute pharyngitis   . Coronary atherosclerosis of unspecified type of vessel, native or graft   . Allergic rhinitis due to other allergen   . Dysfunction of eustachian tube   . Neuropathy [facility])     Patient Active Problem List   Diagnosis Date Noted  . A-fib (HCC) 08/19/2014  . 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 Acute diastolic CHF (congestive heart failure) (HCC) 12/24/2013  . Encounter for therapeutic drug monitoring 11/19/2013  . Leg edema 05/13/2013  . Chest pain 02/05/2013  . Diabetes (HCC) 01/10/2013  . Diabetes mellitus, type 2 (HCC) 01/10/2013  . Long term (current) use of  anticoagulants 10/23/2012  . High-voltage impedance alert 10/11/2011  . Nonischemic cardiomyopathy (HCC)   . Atrial fibrillation-paroxysmal   . ICD-Medtronic 11/08/2010  . Automatic implantable cardioverter-defibrillator in situ 11/08/2010  . HYPERLIPIDEMIA-MIXED 08/10/2010  . HYPERTENSION, BENIGN 08/10/2010  . CARDIAC ARREST 08/10/2010    Past Surgical History  Procedure Laterality Date  . Cardiac defibrillator placement  08/01/2010    [facility], Dr. Graciela Husbands  . Knee surgery    . Tonsillectomy    . Colonoscopy    . Cataract extraction      Past Surgical History  Procedure Laterality Date  . Cardiac defibrillator placement  08/01/2010    [facility], Dr. Graciela Husbands  . Knee surgery    . Tonsillectomy    . Colonoscopy    . Cataract extraction      Current Outpatient Rx  Name  Route  Sig  Dispense  Refill  . albuterol (PROVENTIL HFA;VENTOLIN HFA) 108 (90 Base) MCG/ACT inhaler   Inhalation   Inhale 2 puffs into the lungs every 6 (six) hours as needed for wheezing or shortness of breath.   1 Inhaler   2   . amiodarone (PACERONE) 200 MG tablet   Oral   Take 0.5 tablets (100 mg total) by mouth daily.   30 tablet   6   . atorvastatin (LIPITOR) 40 MG tablet      TAKE 1 TABLET EVERY DAY   90 tablet   3   . azithromycin (  ZITHROMAX Z-PAK) 250 MG tablet      Take 2 tablets (500 mg) on  Day 1,  followed by 1 tablet (250 mg) once daily on Days 2 through 5.   6 each   0   . benzonatate (TESSALON PERLES) 100 MG capsule   Oral   Take 2 capsules (200 mg total) by mouth 3 (three) times daily as needed for cough.   21 capsule   0   . gabapentin (NEURONTIN) 300 MG capsule   Oral   Take 300 mg by mouth 3 (three) times daily.          Marland Kitchen glimepiride (AMARYL) 2 MG tablet   Oral   Take 2 mg by mouth daily with breakfast.          . losartan-hydrochlorothiazide (HYZAAR) 100-12.5 MG per tablet   Oral   Take 1 tablet by mouth daily.   90 tablet   3   . metFORMIN (GLUCOPHAGE) 500 MG  tablet   Oral   Take 1,000 mg by mouth daily with breakfast.          . metoprolol succinate (TOPROL-XL) 25 MG 24 hr tablet   Oral   Take 1 tablet (25 mg total) by mouth daily.   30 tablet   0   . nitroGLYCERIN (NITROSTAT) 0.4 MG SL tablet   Sublingual   Place 1 tablet (0.4 mg total) under the tongue every 5 (five) minutes as needed.   25 tablet   1   . potassium chloride (K-DUR) 10 MEQ tablet      Take one tablet by mouth twice daily as needed/ as directed   60 tablet   3   . torsemide (DEMADEX) 20 MG tablet   Oral   Take 2 tablets (40 mg total) by mouth daily.   120 tablet   6   . warfarin (COUMADIN) 5 MG tablet      TAKE AS DIRECTED BY COUMADIN CLINIC....BLOOD THINNER   45 tablet   0     Allergies:  Codeine; Lidocaine; and Lisinopril  Family History: Family History  Problem Relation Age of Onset  . Heart attack Mother   . Heart disease Mother   . Cancer Sister   . Cancer Brother     Social History: Social History  Substance Use Topics  . Smoking status: Never Smoker   . Smokeless tobacco: Never Used  . Alcohol Use: No     Review of Systems:   10 point review of systems was performed and was otherwise negative:  Constitutional: No fever Eyes: No visual disturbances ENT: No sore throat, ear pain Cardiac: Chest pain is described as sharp with coughing. Otherwise the pain is not present. Respiratory: No shortness of breath, wheezing, or stridor Abdomen: No abdominal pain, no vomiting, No diarrhea Endocrine: No weight loss, No night sweats Extremities: No peripheral edema, cyanosis Skin: No rashes, easy bruising Neurologic: No focal weakness, trouble with speech or swollowing Urologic: No dysuria, Hematuria, or urinary frequency   Physical Exam:  ED Triage Vitals  Enc Vitals Group     BP 10/25/15 1100 141/72 mmHg     Pulse Rate 10/25/15 1100 69     Resp 10/25/15 1100 18     Temp 10/25/15 1100 97.9 F (36.6 C)     Temp Source 10/25/15  1100 Oral     SpO2 10/25/15 1100 94 %     Weight 10/25/15 1100 260 lb (117.935 kg)     Height  10/25/15 1100  (1.651 m)     Head Cir --      Peak Flow --      Pain Score 10/25/15 1114 10     Pain Loc --      Pain Edu? --      Excl. in GC? --     General: Awake , Alert , and Oriented times 3; GCS 15 she is having a dry nonproductive cough at the bedside Head: Normal cephalic , atraumatic Eyes: Pupils equal , round, reactive to light Nose/Throat: No nasal drainage, patent upper airway without erythema or exudate.  Neck: Supple, Full range of motion, No anterior adenopathy or palpable thyroid masses Lungs: Clear to ascultation without wheezes , rhonchi, or rales Heart: Regular rate, regular rhythm without murmurs , gallops , or rubs Abdomen: Soft, non tender without rebound, guarding , or rigidity; bowel sounds positive and symmetric in all 4 quadrants. No organomegaly .        Extremities: 2 plus symmetric pulses. No edema, clubbing or cyanosis Neurologic: normal ambulation, Motor symmetric without deficits, sensory intact Skin: warm, dry, no rashes     EKG:  ED ECG REPORT I, Jennye Moccasin, the attending physician, personally viewed and interpreted this ECG.  Date: 10/25/2015 EKG Time: 1059 Rate: *72 Rhythm: normal sinus rhythm QRS Axis: Left axis deviation Intervals: Left bundle branch block ST/T Wave abnormalities: normal Conduction Disturbances: none Narrative Interpretation: unremarkable  No acute ischemic changes are noted  Radiology:      DATA: Pt presents with cough for 24 hrs, some productive at time with green sputum in color. Also reports tightness in her chest even when she is not coughing.  EXAM: CHEST 2 VIEW  COMPARISON: 01/21/2013  FINDINGS: Cardiac silhouette is normal in size and configuration. No mediastinal or hilar masses or evidence of adenopathy. Left anterior chest wall pacemaker is stable.  Clear lungs. No pleural effusion or  pneumothorax.  Bony thorax is demineralized but intact.  IMPRESSION: No active cardiopulmonary disease.   Electronically Signed By: Amie Portland M.D. On: 10/25/2015 12:25 I personally reviewed the radiologic studies     ED Course: The patient was given a breathing treatment here which did offer some relief in her cough. Her x-ray is negative for any signs of pulmonary edema, infiltrate, pneumothorax, etc. I felt this was unlikely to be a pulmonary embolism based on her clinical presentation and she is oriented on anticoagulation therapy. The patient was prescribed a Zithromax for the possibility of pertussis in the productive nature of her cough, however, she was advised this most likely is a viral illness and antibiotic make prevent her from pneumonia but it was unlikely to show significant improvement with the medication. We also talked about the interactions of prescribed medication with her Coumadin and she'll need to follow up with her primary physician. I prescribed her an inhaler, Zithromax, and Tessalon Perles for cough. She states she tried her husband's narcotic cough medication at home and it made her nauseated.*    Assessment: Acute bronchitis   Final Clinical Impression:   Final diagnoses:  Acute bronchitis, unspecified organism     Plan:  Outpatient management Patient was advised to return immediately if condition worsens. Patient was advised to follow up with their primary care physician or other specialized physicians involved in their outpatient care             Jennye Moccasin, MD 10/25/15 [phone]

## 2015-10-25 NOTE — Discharge Instructions (Signed)
Acute Bronchitis Bronchitis is inflammation of the airways that extend from the windpipe into the lungs (bronchi). The inflammation often causes mucus to develop. This leads to a cough, which is the most common symptom of bronchitis.  In acute bronchitis, the condition usually develops suddenly and goes away over time, usually in a couple weeks. Smoking, allergies, and asthma can make bronchitis worse. Repeated episodes of bronchitis may cause further lung problems.  CAUSES Acute bronchitis is most often caused by the same virus that causes a cold. The virus can spread from person to person (contagious) through coughing, sneezing, and touching contaminated objects. SIGNS AND SYMPTOMS   Cough.   Fever.   Coughing up mucus.   Body aches.   Chest congestion.   Chills.   Shortness of breath.   Sore throat.  DIAGNOSIS  Acute bronchitis is usually diagnosed through a physical exam. Your health care provider will also ask you questions about your medical history. Tests, such as chest X-rays, are sometimes done to rule out other conditions.  TREATMENT  Acute bronchitis usually goes away in a couple weeks. Oftentimes, no medical treatment is necessary. Medicines are sometimes given for relief of fever or cough. Antibiotic medicines are usually not needed but may be prescribed in certain situations. In some cases, an inhaler may be recommended to help reduce shortness of breath and control the cough. A cool mist vaporizer may also be used to help thin bronchial secretions and make it easier to clear the chest.  HOME CARE INSTRUCTIONS  Get plenty of rest.   Drink enough fluids to keep your urine clear or pale yellow (unless you have a medical condition that requires fluid restriction). Increasing fluids may help thin your respiratory secretions (sputum) and reduce chest congestion, and it will prevent dehydration.   Take medicines only as directed by your health care provider.  If  you were prescribed an antibiotic medicine, finish it all even if you start to feel better.  Avoid smoking and secondhand smoke. Exposure to cigarette smoke or irritating chemicals will make bronchitis worse. If you are a smoker, consider using nicotine gum or skin patches to help control withdrawal symptoms. Quitting smoking will help your lungs heal faster.   Reduce the chances of another bout of acute bronchitis by washing your hands frequently, avoiding people with cold symptoms, and trying not to touch your hands to your mouth, nose, or eyes.   Keep all follow-up visits as directed by your health care provider.  SEEK MEDICAL CARE IF: Your symptoms do not improve after 1 week of treatment.  SEEK IMMEDIATE MEDICAL CARE IF:  You develop an increased fever or chills.   You have chest pain.   You have severe shortness of breath.  You have bloody sputum.   You develop dehydration.  You faint or repeatedly feel like you are going to pass out.  You develop repeated vomiting.  You develop a severe headache. MAKE SURE YOU:   Understand these instructions.  Will watch your condition.  Will get help right away if you are not doing well or get worse.   This information is not intended to replace advice given to you by your health care provider. Make sure you discuss any questions you have with your health care provider.   Document Released: 09/28/2004 Document Revised: 09/11/2014 Document Reviewed: 02/11/2013 Elsevier Interactive Patient Education 2016 [employer]  Acute Bronchitis Bronchitis is when the airways that extend from the windpipe into the lungs get red, puffy,  and painful (inflamed). Bronchitis often causes thick spit (mucus) to develop. This leads to a cough. A cough is the most common symptom of bronchitis. In acute bronchitis, the condition usually begins suddenly and goes away over time (usually in 2 weeks). Smoking, allergies, and asthma can make bronchitis  worse. Repeated episodes of bronchitis may cause more lung problems. HOME CARE  Rest.  Drink enough fluids to keep your pee (urine) clear or pale yellow (unless you need to limit fluids as told by your doctor).  Only take over-the-counter or prescription medicines as told by your doctor.  Avoid smoking and secondhand smoke. These can make bronchitis worse. If you are a smoker, think about using nicotine gum or skin patches. Quitting smoking will help your lungs heal faster.  Reduce the chance of getting bronchitis again by:  Washing your hands often.  Avoiding people with cold symptoms.  Trying not to touch your hands to your mouth, nose, or eyes.  Follow up with your doctor as told. GET HELP IF: Your symptoms do not improve after 1 week of treatment. Symptoms include:  Cough.  Fever.  Coughing up thick spit.  Body aches.  Chest congestion.  Chills.  Shortness of breath.  Sore throat. GET HELP RIGHT AWAY IF:   You have an increased fever.  You have chills.  You have severe shortness of breath.  You have bloody thick spit (sputum).  You throw up (vomit) often.  You lose too much body fluid (dehydration).  You have a severe headache.  You faint. MAKE SURE YOU:   Understand these instructions.  Will watch your condition.  Will get help right away if you are not doing well or get worse.   This information is not intended to replace advice given to you by your health care provider. Make sure you discuss any questions you have with your health care provider.   Document Released: 02/07/2008 Document Revised: 04/23/2013 Document Reviewed: 02/11/2013 Elsevier Interactive Patient Education [employer]

## 2017-08-21 ENCOUNTER — Other Ambulatory Visit: Payer: Medicare Other

## 2017-08-30 ENCOUNTER — Other Ambulatory Visit: Payer: Self-pay

## 2017-08-30 ENCOUNTER — Encounter
Admission: RE | Admit: 2017-08-30 | Discharge: 2017-08-30 | Disposition: A | Discharge status: Home / Self Care | Payer: Medicare Other | Source: Ambulatory Visit | Attending: Cardiology | Admitting: Cardiology

## 2017-08-30 DIAGNOSIS — Z01818 Encounter for other preprocedural examination: Secondary | ICD-10-CM | POA: Diagnosis not present

## 2017-08-30 DIAGNOSIS — Z4502 Encounter for adjustment and management of automatic implantable cardiac defibrillator: Secondary | ICD-10-CM | POA: Insufficient documentation

## 2017-08-30 DIAGNOSIS — I447 Left bundle-branch block, unspecified: Secondary | ICD-10-CM | POA: Insufficient documentation

## 2017-08-30 HISTORY — DX: Nausea with vomiting, unspecified: Z98.890

## 2017-08-30 HISTORY — DX: Other specified postprocedural states: R11.2

## 2017-08-30 LAB — CBC
HCT: 44.4 % (ref 35.0–47.0)
HEMOGLOBIN: 14.4 g/dL (ref 12.0–16.0)
MCH: 28.6 pg (ref 26.0–34.0)
MCHC: 32.5 g/dL (ref 32.0–36.0)
MCV: 87.8 fL (ref 80.0–100.0)
Platelets: 200 10*3/uL (ref 150–440)
RBC: 5.05 MIL/uL (ref 3.80–5.20)
RDW: 14.5 % (ref 11.5–14.5)
WBC: 10.3 10*3/uL (ref 3.6–11.0)

## 2017-08-30 NOTE — Patient Instructions (Signed)
Your procedure is scheduled on: Wednesday 09/05/17 Report to DAY SURGERY. 2ND FLOOR MEDICAL MALL ENTRANCE. To find out your arrival time please call [phone] between 1PM - 3PM on Monday 12/331/18.  Remember: Instructions that are not followed completely may result in serious medical risk, up to and including death, or upon the discretion of your surgeon and anesthesiologist your surgery may need to be rescheduled.    __X__ 1. Do not eat anything after midnight the night before your    procedure.  No gum chewing or hard candies.  You may drink clear   liquids up to 2 hours before you are scheduled to arrive at the   hospital for your procedure. Do not drink clear liquids within 2   hours of scheduled arrival to the hospital as this may lead to your   procedure being delayed or rescheduled.       Clear liquids include:   Water or Apple juice without pulp   Clear carbohydrate beverage such as Clearfast or Gatorade   Black coffee or Clear Tea (no milk, no creamer, do not add anything   to the coffee or tea)    Diabetics should only drink water   __X__ 2. No Alcohol for 24 hours before or after surgery.   ____ 3. Bring all medications with you on the day of surgery if instructed.    __X__ 4. Notify your doctor if there is any change in your medical condition     (cold, fever, infections).             __X___5. No smoking within 24 hours of your surgery.     Do not wear jewelry, make-up, hairpins, clips or nail polish.  Do not wear lotions, powders, or perfumes.   Do not shave 48 hours prior to surgery. Men may shave face and neck.  Do not bring valuables to the hospital.    [facility] is not responsible for any belongings or valuables.               Contacts, dentures or bridgework may not be worn into surgery.  Leave your suitcase in the car. After surgery it may be brought to your room.  For patients admitted to the hospital, discharge time is determined by your                 treatment team.   Patients discharged the day of surgery will not be allowed to drive home.   Please read over the following fact sheets that you were given:   MRSA Information   __X__ Take these medicines the morning of surgery with A SIP OF WATER:    1. AMIODARONE  2. ATORVASTATIN  3. GABAPENTIN  4. METOPROLOL  5.  6.  ____ Fleet Enema (as directed)   __X__ Use CHG Soap/SAGE wipes as directed  ____ Use inhalers on the day of surgery  __X__ Stop metformin 2 days prior to surgery    ____ Take 1/2 of usual insulin dose the night before surgery and none on the morning of surgery.   __X__ Stop Coumadin/Plavix/aspirin on AS INSTRUCTED BY CARDIOLOGY  __X__ Stop Anti-inflammatories such as Advil, Aleve, Ibuprofen, Motrin, Naproxen, Naprosyn, Goodies,powder, or aspirin products.  OK to take Tylenol.   __X__ Stop supplements, Vitamin E, Fish Oil until after surgery.    ____ Bring C-Pap to the hospital.

## 2017-09-04 MED ORDER — CEFAZOLIN SODIUM-DEXTROSE 2-4 GM/100ML-% IV SOLN
2.0000 g | Freq: Once | INTRAVENOUS | Status: AC
  Administered 2017-09-05: 2 g via INTRAVENOUS

## 2017-09-05 ENCOUNTER — Ambulatory Visit: Payer: Medicare Other | Admitting: Anesthesiology

## 2017-09-05 ENCOUNTER — Ambulatory Visit
Admission: RE | Admit: 2017-09-05 | Discharge: 2017-09-05 | Disposition: A | Discharge status: Home / Self Care | Payer: Medicare Other | Source: Ambulatory Visit | Attending: Cardiology | Admitting: Cardiology

## 2017-09-05 ENCOUNTER — Encounter: Payer: Self-pay | Admitting: Certified Registered Nurse Anesthetist

## 2017-09-05 ENCOUNTER — Encounter
Admission: RE | Disposition: A | Discharge status: Home / Self Care | Payer: Self-pay | Source: Ambulatory Visit | Attending: Cardiology

## 2017-09-05 DIAGNOSIS — Z7901 Long term (current) use of anticoagulants: Secondary | ICD-10-CM | POA: Diagnosis not present

## 2017-09-05 DIAGNOSIS — Z006 Encounter for examination for normal comparison and control in clinical research program: Secondary | ICD-10-CM | POA: Diagnosis not present

## 2017-09-05 DIAGNOSIS — Z4502 Encounter for adjustment and management of automatic implantable cardiac defibrillator: Secondary | ICD-10-CM | POA: Diagnosis not present

## 2017-09-05 DIAGNOSIS — I480 Paroxysmal atrial fibrillation: Secondary | ICD-10-CM | POA: Diagnosis not present

## 2017-09-05 DIAGNOSIS — Z888 Allergy status to other drugs, medicaments and biological substances status: Secondary | ICD-10-CM | POA: Insufficient documentation

## 2017-09-05 DIAGNOSIS — I2510 Atherosclerotic heart disease of native coronary artery without angina pectoris: Secondary | ICD-10-CM | POA: Insufficient documentation

## 2017-09-05 DIAGNOSIS — I5032 Chronic diastolic (congestive) heart failure: Secondary | ICD-10-CM | POA: Insufficient documentation

## 2017-09-05 DIAGNOSIS — Z95810 Presence of automatic (implantable) cardiac defibrillator: Secondary | ICD-10-CM | POA: Insufficient documentation

## 2017-09-05 DIAGNOSIS — Z79899 Other long term (current) drug therapy: Secondary | ICD-10-CM | POA: Insufficient documentation

## 2017-09-05 DIAGNOSIS — I429 Cardiomyopathy, unspecified: Secondary | ICD-10-CM | POA: Diagnosis not present

## 2017-09-05 DIAGNOSIS — Z7984 Long term (current) use of oral hypoglycemic drugs: Secondary | ICD-10-CM | POA: Diagnosis not present

## 2017-09-05 DIAGNOSIS — E119 Type 2 diabetes mellitus without complications: Secondary | ICD-10-CM | POA: Insufficient documentation

## 2017-09-05 DIAGNOSIS — E785 Hyperlipidemia, unspecified: Secondary | ICD-10-CM | POA: Insufficient documentation

## 2017-09-05 DIAGNOSIS — Z885 Allergy status to narcotic agent status: Secondary | ICD-10-CM | POA: Diagnosis not present

## 2017-09-05 DIAGNOSIS — Z45010 Encounter for checking and testing of cardiac pacemaker pulse generator [battery]: Secondary | ICD-10-CM

## 2017-09-05 DIAGNOSIS — I110 Hypertensive heart disease with heart failure: Secondary | ICD-10-CM | POA: Diagnosis not present

## 2017-09-05 DIAGNOSIS — R0789 Other chest pain: Secondary | ICD-10-CM | POA: Diagnosis not present

## 2017-09-05 DIAGNOSIS — Z884 Allergy status to anesthetic agent status: Secondary | ICD-10-CM | POA: Insufficient documentation

## 2017-09-05 DIAGNOSIS — Z8249 Family history of ischemic heart disease and other diseases of the circulatory system: Secondary | ICD-10-CM | POA: Insufficient documentation

## 2017-09-05 HISTORY — PX: IMPLANTABLE CARDIOVERTER DEFIBRILLATOR (ICD) GENERATOR CHANGE: SHX5469

## 2017-09-05 LAB — SURGICAL PCR SCREEN
MRSA, PCR: NEGATIVE
Staphylococcus aureus: NEGATIVE

## 2017-09-05 LAB — APTT: aPTT: 29 seconds (ref 24–36)

## 2017-09-05 LAB — GLUCOSE, CAPILLARY
Glucose-Capillary: 124 mg/dL — ABNORMAL HIGH (ref 65–99)
Glucose-Capillary: 103 mg/dL — ABNORMAL HIGH (ref 65–99)

## 2017-09-05 LAB — PROTIME-INR
INR: 0.99
PROTHROMBIN TIME: 13 s (ref 11.4–15.2)

## 2017-09-05 SURGERY — ICD GENERATOR CHANGE
Anesthesia: General | Site: Chest | Laterality: Left | Wound class: Clean

## 2017-09-05 MED ORDER — ONDANSETRON HCL 4 MG/2ML IJ SOLN
INTRAMUSCULAR | Status: DC | PRN
  Administered 2017-09-05: 4 mg via INTRAVENOUS

## 2017-09-05 MED ORDER — GENTAMICIN SULFATE 40 MG/ML IJ SOLN
Freq: Once | INTRAMUSCULAR | Status: DC
  Filled 2017-09-05: qty 2

## 2017-09-05 MED ORDER — LIDOCAINE HCL (CARDIAC) 20 MG/ML IV SOLN
INTRAVENOUS | Status: DC | PRN
  Administered 2017-09-05: 50 mg via INTRAVENOUS

## 2017-09-05 MED ORDER — PROPOFOL 500 MG/50ML IV EMUL
INTRAVENOUS | Status: DC | PRN
  Administered 2017-09-05: 100 ug/kg/min via INTRAVENOUS

## 2017-09-05 MED ORDER — PROPOFOL 10 MG/ML IV BOLUS
INTRAVENOUS | Status: DC | PRN
  Administered 2017-09-05: 50 mg via INTRAVENOUS

## 2017-09-05 MED ORDER — LIDOCAINE 1 % OPTIME INJ - NO CHARGE
INTRAMUSCULAR | Status: DC | PRN
  Administered 2017-09-05: 20 mL

## 2017-09-05 MED ORDER — PROPOFOL 10 MG/ML IV BOLUS
INTRAVENOUS | Status: AC
  Filled 2017-09-05: qty 20

## 2017-09-05 MED ORDER — CEPHALEXIN 250 MG PO CAPS: 500.0000 mg | ORAL_CAPSULE | Freq: Two times a day (BID) | ORAL | 0 refills | Status: AC

## 2017-09-05 MED ORDER — FAMOTIDINE 20 MG PO TABS
ORAL_TABLET | ORAL | Status: AC
  Administered 2017-09-05: 20 mg via ORAL
  Filled 2017-09-05: qty 1

## 2017-09-05 MED ORDER — ONDANSETRON HCL 4 MG/2ML IJ SOLN
INTRAMUSCULAR | Status: AC
  Filled 2017-09-05: qty 2

## 2017-09-05 MED ORDER — HEPARIN SODIUM (PORCINE) 5000 UNIT/ML IJ SOLN
INTRAMUSCULAR | Status: AC
  Filled 2017-09-05: qty 1

## 2017-09-05 MED ORDER — PROPOFOL 500 MG/50ML IV EMUL
INTRAVENOUS | Status: AC
  Filled 2017-09-05: qty 50

## 2017-09-05 MED ORDER — FAMOTIDINE 20 MG PO TABS
20.0000 mg | ORAL_TABLET | Freq: Once | ORAL | Status: AC
  Administered 2017-09-05: 20 mg via ORAL

## 2017-09-05 MED ORDER — FENTANYL CITRATE (PF) 100 MCG/2ML IJ SOLN
INTRAMUSCULAR | Status: DC | PRN
  Administered 2017-09-05 (×2): 25 ug via INTRAVENOUS

## 2017-09-05 MED ORDER — LIDOCAINE HCL (PF) 2 % IJ SOLN
INTRAMUSCULAR | Status: AC
  Filled 2017-09-05: qty 10

## 2017-09-05 MED ORDER — DEXAMETHASONE SODIUM PHOSPHATE 10 MG/ML IJ SOLN
INTRAMUSCULAR | Status: AC
  Filled 2017-09-05: qty 1

## 2017-09-05 MED ORDER — FENTANYL CITRATE (PF) 100 MCG/2ML IJ SOLN
INTRAMUSCULAR | Status: AC
  Filled 2017-09-05: qty 2

## 2017-09-05 MED ORDER — SODIUM CHLORIDE 0.9 % IV SOLN
INTRAVENOUS | Status: DC
  Administered 2017-09-05 (×2): via INTRAVENOUS

## 2017-09-05 MED ORDER — GENTAMICIN SULFATE 40 MG/ML IJ SOLN
INTRAMUSCULAR | Status: AC
  Filled 2017-09-05: qty 2

## 2017-09-05 MED ORDER — DEXAMETHASONE SODIUM PHOSPHATE 10 MG/ML IJ SOLN
INTRAMUSCULAR | Status: DC | PRN
  Administered 2017-09-05: 5 mg via INTRAVENOUS

## 2017-09-05 MED ORDER — ONDANSETRON HCL 4 MG/2ML IJ SOLN: 4.0000 mg | Freq: Once | INTRAMUSCULAR | Status: DC | PRN

## 2017-09-05 MED ORDER — CEFAZOLIN SODIUM-DEXTROSE 2-4 GM/100ML-% IV SOLN
INTRAVENOUS | Status: AC
  Filled 2017-09-05: qty 100

## 2017-09-05 MED ORDER — FENTANYL CITRATE (PF) 100 MCG/2ML IJ SOLN: 25.0000 ug | INTRAMUSCULAR | Status: DC | PRN

## 2017-09-05 SURGICAL SUPPLY — 48 items
BAG DECANTER FOR FLEXI CONT (MISCELLANEOUS) ×1 IMPLANT
BLADE PHOTON ILLUMINATED (MISCELLANEOUS) ×3 IMPLANT
BLADE SURG SZ10 CARB STEEL (BLADE) ×3 IMPLANT
CANISTER SUCT 1200ML W/VALVE (MISCELLANEOUS) ×3 IMPLANT
CHLORAPREP W/TINT 26ML (MISCELLANEOUS) ×3 IMPLANT
CLOSURE WOUND 1/2 X4 (GAUZE/BANDAGES/DRESSINGS) ×1
COVER LIGHT HANDLE STERIS (MISCELLANEOUS) ×6 IMPLANT
DRAPE C-ARM XRAY 36X54 (DRAPES) ×1 IMPLANT
DRAPE INCISE IOBAN 66X45 STRL (DRAPES) ×3 IMPLANT
DRAPE LAPAROTOMY 77X122 PED (DRAPES) ×1 IMPLANT
DRSG TEGADERM 4X4.75 (GAUZE/BANDAGES/DRESSINGS) ×3 IMPLANT
DRSG TEGADERM 6X8 (GAUZE/BANDAGES/DRESSINGS) ×3 IMPLANT
ELECT REM PT RETURN 9FT ADLT (ELECTROSURGICAL) ×3
ELECTRODE REM PT RTRN 9FT ADLT (ELECTROSURGICAL) ×1 IMPLANT
GLOVE BIO SURGEON STRL SZ7.5 (GLOVE) ×3 IMPLANT
GLOVE BIO SURGEON STRL SZ8 (GLOVE) ×3 IMPLANT
GOWN STRL REUS W/ TWL LRG LVL3 (GOWN DISPOSABLE) ×1 IMPLANT
GOWN STRL REUS W/ TWL XL LVL3 (GOWN DISPOSABLE) ×1 IMPLANT
GOWN STRL REUS W/TWL LRG LVL3 (GOWN DISPOSABLE) ×3
GOWN STRL REUS W/TWL XL LVL3 (GOWN DISPOSABLE) ×3
GRADUATE 1200CC STRL 31836 (MISCELLANEOUS) ×3 IMPLANT
ICD EVERA XT MRI DF1  DDMB1D1 (ICD Generator) ×2 IMPLANT
ICD EVERA XT MRI DF1 DDMB1D1 (ICD Generator) IMPLANT
IV NS 1000ML (IV SOLUTION) ×3
IV NS 1000ML BAXH (IV SOLUTION) ×1 IMPLANT
KIT RM TURNOVER STRD PROC AR (KITS) ×3 IMPLANT
NDL FILTER BLUNT 18X1 1/2 (NEEDLE) ×1 IMPLANT
NDL HYPO 25X1 1.5 SAFETY (NEEDLE) ×1 IMPLANT
NDL SPNL 22GX3.5 QUINCKE BK (NEEDLE) ×1 IMPLANT
NEEDLE FILTER BLUNT 18X 1/2SAF (NEEDLE) ×2
NEEDLE FILTER BLUNT 18X1 1/2 (NEEDLE) ×1 IMPLANT
NEEDLE HYPO 25X1 1.5 SAFETY (NEEDLE) ×3 IMPLANT
NEEDLE SPNL 22GX3.5 QUINCKE BK (NEEDLE) ×3 IMPLANT
NS IRRIG 500ML POUR BTL (IV SOLUTION) ×3 IMPLANT
PACK BASIN MINOR ARMC (MISCELLANEOUS) ×3 IMPLANT
PACK PACE INSERTION (MISCELLANEOUS) ×3 IMPLANT
PAD STATPAD (MISCELLANEOUS) ×3 IMPLANT
STRAP SAFETY BODY (MISCELLANEOUS) ×3 IMPLANT
STRIP CLOSURE SKIN 1/2X4 (GAUZE/BANDAGES/DRESSINGS) ×2 IMPLANT
SUT SILK 2 0 SH (SUTURE) ×1 IMPLANT
SUT VIC AB 2-0 CT1 27 (SUTURE)
SUT VIC AB 2-0 CT1 TAPERPNT 27 (SUTURE) ×1 IMPLANT
SUT VIC AB 2-0 CT2 27 (SUTURE) ×3 IMPLANT
SUT VIC AB 3-0 PS2 18 (SUTURE) ×1 IMPLANT
SUT VIC AB 4-0 PS2 18 (SUTURE) ×3 IMPLANT
SYR 10ML LL (SYRINGE) ×3 IMPLANT
SYR BULB IRRIG 60ML STRL (SYRINGE) ×3 IMPLANT
SYR CONTROL 10ML (SYRINGE) ×3 IMPLANT

## 2017-09-05 NOTE — Transfer of Care (Signed)
Immediate Anesthesia Transfer of Care Note  Patient: Lacey Lawrence  Procedure(s) Performed: ICD GENERATOR CHANGE (Left Chest)  Patient Location: PACU  Anesthesia Type:MAC  Level of Consciousness: awake, alert , oriented and patient cooperative  Airway & Oxygen Therapy: Patient Spontanous Breathing and Patient connected to face mask oxygen  Post-op Assessment: Report given to RN and Post -op Vital signs reviewed and stable  Post vital signs: Reviewed and stable  Last Vitals:  Vitals:   09/05/17 1057  BP: (!) 161/56  Pulse: 76  Resp: 17  Temp: 36.5 C  SpO2: 99%    Last Pain:  Vitals:   09/05/17 1057  TempSrc: Tympanic         Complications: No apparent anesthesia complications

## 2017-09-05 NOTE — Anesthesia Preprocedure Evaluation (Signed)
Anesthesia Evaluation  Patient identified by MRN, date of birth, ID band Patient awake    Reviewed: Allergy & Precautions, H&P , NPO status , Patient's Chart, lab work & pertinent test results, reviewed documented beta blocker date and time   History of Anesthesia Complications (+) PONV and history of anesthetic complications  Airway Mallampati: III  TM Distance: >3 FB Neck ROM: full    Dental  (+) Dental Advidsory Given, Missing, Teeth Intact   Pulmonary neg pulmonary ROS,           Cardiovascular Exercise Tolerance: Good hypertension, (-) angina+ CAD and +CHF  (-) Past MI, (-) Cardiac Stents and (-) CABG + dysrhythmias Atrial Fibrillation + pacemaker + Cardiac Defibrillator (-) Valvular Problems/Murmurs     Neuro/Psych neg Seizures  Neuromuscular disease negative psych ROS   GI/Hepatic negative GI ROS, Neg liver ROS,   Endo/Other  diabetes  Renal/GU negative Renal ROS  negative genitourinary   Musculoskeletal   Abdominal   Peds  Hematology negative hematology ROS (+)   Anesthesia Other Findings Past Medical History: No date: Acute pharyngitis No date: Allergic rhinitis due to other allergen No date: Atrial fibrillation-paroxysmal     Comment:  up to  5 hour episodes detected on her ICD No date: Cardiac arrest -aborted No date: Coronary atherosclerosis of unspecified type of vessel,  native or graft No date: Diabetes mellitus No date: Diabetic neuropathy (HCC) No date: Dual ICD-Medtronic No date: Dysfunction of eustachian tube No date: Hypertension No date: Memory loss No date: Neuropathy No date: Other and unspecified hyperlipidemia No date: Other primary cardiomyopathies No date: PONV (postoperative nausea and vomiting)   Reproductive/Obstetrics negative OB ROS                             Anesthesia Physical Anesthesia Plan  ASA: III  Anesthesia Plan: General   Post-op  Pain Management:    Induction: Intravenous  PONV Risk Score and Plan: Propofol infusion, Ondansetron and Dexamethasone  Airway Management Planned: Simple Face Mask  Additional Equipment:   Intra-op Plan:   Post-operative Plan:   Informed Consent: I have reviewed the patients History and Physical, chart, labs and discussed the procedure including the risks, benefits and alternatives for the proposed anesthesia with the patient or authorized representative who has indicated his/her understanding and acceptance.   Dental Advisory Given  Plan Discussed with: Anesthesiologist, CRNA and Surgeon  Anesthesia Plan Comments:         Anesthesia Quick Evaluation

## 2017-09-05 NOTE — OR Nursing (Signed)
Explanted Pacemaker Medtronic Virtuoso SN: [id number]

## 2017-09-05 NOTE — Anesthesia Post-op Follow-up Note (Signed)
Anesthesia QCDR form completed.        

## 2017-09-05 NOTE — H&P (Signed)
Jump to Section ? Discontinued MedicationsDocument InformationEncounter DetailsHistorical MedicationsLast Filed Vital SignsPatient DemographicsPlan of TreatmentProgress NotesReason for [employer] for VisitSocial HistoryVisit Diagnoses [place] Ana Encounter Summary, generated on Dec. 27, 2018 Printout Information  Document Contents Document Received Date Document Source Organization  Office Visit Dec. 27, 2018 [facility]   Patient Demographics - 72 y.o. Female; born 09-Jul-1946   Patient Address Communication Language Race / Ethnicity  [address] [place], [place] [address] [phone] (Home) [phone] (Mobile) [email or web address] English (Preferred) White / Not Hispanic or Latino   Reason for Referral  Procedure (Routine) Procedure (Routine)  Status Reason Specialty Diagnoses / Procedures Referred By Contact Referred To Contact  Authorized   Diagnoses  Chronic diastolic CHF (congestive heart failure), NYHA class 3 (CMS-HCC)    Procedures  Echo complete  Olena Heckle, MD  [address]  [facility]  [place], [place] [address]  Phone: [phone]  Fax: [phone]         Reason for Visit   Reason Comments  Dizziness patient stated a little bit   Pacer-ICD     Encounter Details   Date Type Department Care Team Description  08/07/2017 Office Visit [facility]  [address]  [place], [place] [address]  [phone]  Olena Heckle, MD  [address]  [facility]  [place], [place] [address]  [phone]  [phone] (Fax)  Chronic diastolic CHF (congestive heart failure), NYHA class 3 (CMS-HCC) (Primary Dx);  Automatic implantable cardioverter-defibrillator in situ   Social History - as of this encounter  Tobacco Use Types Packs/Day Years Used Date  Never Smoker      Smokeless Tobacco: Never Used      Alcohol  Use Drinks/Week oz/Week Comments  No 0 Standard drinks or equivalent  0.0     Sex Assigned at [employer] Date Recorded  Not on file    Job Start Date Occupation Industry  Not on file Not on file Not on file   Travel History Travel Start Travel End  No recent travel history available.     Last Filed Vital Signs - in this encounter  Vital Sign Reading Time Taken  Blood Pressure 130/90 08/07/2017 11:43 AM EST  Pulse 62 08/07/2017 11:43 AM EST  Temperature - -  Respiratory Rate 17 08/07/2017 11:43 AM EST  Oxygen Saturation 95% 08/07/2017 11:43 AM EST  Inhaled Oxygen Concentration - -  Weight 113.8 kg (250 lb 12.8 oz) 08/07/2017 11:43 AM EST  Height 167.6 cm (5\' 6" ) 08/07/2017 11:43 AM EST  Body Mass Index 40.48 08/07/2017 11:43 AM EST   Progress Notes - in this encounter  Olena Heckle, MD - 08/07/2017 11:30 AM EST Formatting of this note might be different from the original. Established Patient Visit   Chief Complaint: Chief Complaint  Patient presents with  . Dizziness  patient stated a little bit  . Pacer-ICD  Date of Service: 08/07/2017 Date of Birth: 27-Jan-1946 [facility]  History of Present Illness: Ms. 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 The patient currently has symptoms including dyspnea and exertional chest pressure/discomfort. Patient appears euvolemic.. We have discussed risk factor modification as well as medication management of this problem Defibrillator Interrogation The patient has had the placement of  a defibrillator for significant LV systolic dysfunction. Recent interrogation has revealed that the battery voltage is low indicating the need for  change out for a new battery. The patient understands the risks and benefits of the defibrillator battery change. Atrial Fibrillation The patient has non-valvular atrial fibrillation Paroxysmal symptomatic with shortness of breath and dyspnea on exertion currently on metoprolol and amiodarone medication maintaining NSR. The atrial fibrillation has occurred rarely and can be associated with dizziness stable. The etiology of this atrial fibrillation includes Hypertension, valvular heart disease, sick sinus syndrome and structural heart disease and/or dysfunction. The identified symptoms associated with the patient's atrial fibrillation have affected the patients functionality and/or quality of life with a severity of atrial fibrillation scale of 1. The patient has been on anticoagulation   Past Medical and Surgical History  Past Medical History Past Medical History:  Diagnosis Date  . Atrial fibrillation , unspecified (CMS-HCC)  . CHF (congestive heart failure) (CMS-HCC)  . Coronary artery disease  . Diabetes mellitus type 2, uncomplicated (CMS-HCC)  . Hyperlipidemia, unspecified  . Hypertension   Past Surgical History She has no past surgical history on file.   Medications and Allergies  Current Medications  Current Outpatient Medications on File Prior to Visit  Medication Sig Dispense Refill  . AMIOdarone (PACERONE) 200 MG tablet Take 0.5 tablets (100 mg total) by mouth once daily. 5  . atorvastatin (LIPITOR) 40 MG tablet Take 40 mg by mouth once daily.  Marland Kitchen gabapentin (NEURONTIN) 300 MG capsule Take 1,500 mg by mouth 3 (three) times daily.  Marland Kitchen glimepiride (AMARYL) 2 MG tablet Take 2 mg by mouth 2 (two) times daily. 0  . KLOR-CON M10 10 mEq ER tablet Take 10 mEq by mouth 2 (two) times daily. 2  . linagliptin 5 mg Tab Take 5 mg by mouth once daily  . losartan-hydrochlorothiazide (HYZAAR) 100-12.5 mg tablet Take 1 tablet by mouth once daily.  . metFORMIN (GLUCOPHAGE-XR) 500 MG XR tablet Take  1,000 mg by mouth once daily. 0  . metoprolol succinate (TOPROL-XL) 25 MG XL tablet Take 25 mg by mouth once daily. 0  . nitroGLYcerin (NITROSTAT) 0.4 MG SL tablet Place under the tongue.  . TORsemide (DEMADEX) 20 MG tablet Take 40 mg by mouth once daily.  Marland Kitchen warfarin (COUMADIN) 5 MG tablet 1   No current facility-administered medications on file prior to visit.   Allergies: Codeine; Lidocaine; and Lisinopril  Social and Family History  Social History reports that she has never smoked. She has never used smokeless tobacco. She reports that she does not drink alcohol or use drugs.  Family History Family History  Problem Relation Age of Onset  . Myocardial Infarction (Heart attack) Mother   Review of Systems   Review of Systems  Positive for sob Negative for weight gain weight loss, weakness, vision change, hearing loss, cough, congestion, PND, orthopnea, heartburn, nausea, diaphoresis, vomiting, diarrhea, bloody stool, melena, stomach pain, extremity pain, leg weakness, leg cramping, leg blood clots, headache, blackouts, nosebleed, trouble swallowing, mouth pain, urinary frequency, urination at night, muscle weakness, skin lesions, skin rashes, tingling ,ulcers, numbness, anxiety, and/or depression Physical Examination   Vitals:BP (!) 130/90 (BP Location: Left upper arm, Patient Position: Sitting, BP Cuff Size: Large Adult)  Pulse 62  Resp 17  Ht 167.6 cm (5\' 6" )  Wt (!) 113.8 kg (250 lb 12.8 oz)  SpO2 95%  BMI 40.48 kg/m  Ht:167.6 cm (5\' 6" ) Wt:(!) 113.8 kg (250 lb 12.8 oz) WGN:FAOZ surface area is 2.3  meters squared. Body mass index is 40.48 kg/m. Appearance: well appearing in no acute distress HEENT: Pupils equally reactive to light and accomodation, no xanthalasma  Neck: Supple, no apparent thyromegaly, masses, or lymphadenopathy  Lungs: normal respiratory effort; no crackles, no rhonchi, no wheezes Heart: Regular rate and rhythm. Normal S1 S2 No gallops, 2+rusbmurmur, no  rub, PMI is normal size and placement. carotid upstroke normal without bruit. Jugular venous pressure is normal Abdomen: soft, nontender, not distended with normal bowel sounds. No apparent hepatosplenomegally. Abdominal aorta is normal size without bruit Extremities: no edema, no ulcers, no clubbing, no cyanosis Peripheral Pulses: 2+ in upper extremities, 2+ femoral pulses bilaterally, 2+lower extremity  Musculoskeletal; Normal muscle tone without kyphosis Neurological: Oriented and Alert, Cranial nerves intact  Assessment   72 y.o. female with  Encounter Diagnoses  Name Primary?  . Chronic diastolic CHF (congestive heart failure), NYHA class 3 (CMS-HCC) Yes  . Automatic implantable cardioverter-defibrillator in situ   Plan  -The patient is to have consultation and subsequent change of battery of the defibrillator. The patient understands all the risks and benefits of this procedure. This includes the possibility of death, stroke, heart attack, hemopericardium, pneumothorax, infection, bleeding, blood clot, and or reaction to medications. The patient is at low risk for general anesthesia. -Echocardiogram for further evaluation of dyspnea with cardiomyopathy, atrial fibrillation, valvular heart disease and cardiomegally   Orders Placed This Encounter  Procedures  . Echo complete   No follow-ups on file.  Olena Heckle, MD    Electronically Signed by Olena Heckle, MD on 08/07/2017 11:30 AM EST     Plan of Treatment - as of this encounter  Upcoming Encounters Upcoming Encounters  Date Type Specialty Care Team Description  09/12/2017 Appointment Cardiology Olena Heckle, MD  [address]  [facility]  [place], [place] [address]  [phone]  [phone] ([phone]    09/12/2017 Office Visit Cardiology Olena Heckle, MD  [address]  [facility]  [place], [place] [address]  [phone]    [phone] (Fax)    11/15/2017 Procedure visit Cardiology Olena Heckle, MD  [address]  [facility]  [place], [place] [address]  [phone]  [phone] (Fax)     Scheduled Tests Scheduled Tests  Name Priority Associated Diagnoses Order Schedule  Echo complete Routine Chronic diastolic CHF (congestive heart failure), NYHA class 3  1 Occurrences starting 08/07/2017 until 08/08/2018   Visit Diagnoses - in this encounter  Diagnosis  Chronic diastolic CHF (congestive heart failure), NYHA class 3 (CMS-HCC) - Primary   Automatic implantable cardioverter-defibrillator in situ    Discontinued Medications - as of this encounter  Medication Sig Discontinue Reason Start Date End Date  FLUZONE HIGH-DOSE 2015-16, PF, 180 mcg/0.5 mL Syrg IM syringe   Therapy completed 07/10/2014 08/07/2017   Historical Medications - added in this encounter  This list may reflect changes made after this encounter.  Medication Sig Dispensed Refills Start Date End Date  linagliptin 5 mg Tab  Take 5 mg by mouth once daily  0     Images Document Information  Primary Care Provider Other Service Providers Document Coverage Dates  [facility] (Jul. 01, 2016 - Present) [phone] (Work) [phone] (Fax) [address] [place], [place] [address]   Dec. 04, 2018   Custodian Organization  [facility] [address] [place], [place] [address]   Encounter Providers Encounter Date  Olena Heckle MD (Attending) [phone] (Work) [phone] (Fax)  [address] [facility] [place], [place] [address]  Dec. 04, 2018    Show All Sections

## 2017-09-05 NOTE — Anesthesia Postprocedure Evaluation (Signed)
Anesthesia Post Note  Patient: Lacey Lawrence  Procedure(s) Performed: ICD GENERATOR CHANGE (Left Chest)  Patient location during evaluation: PACU Anesthesia Type: General Level of consciousness: awake and alert Pain management: pain level controlled Vital Signs Assessment: post-procedure vital signs reviewed and stable Respiratory status: spontaneous breathing, nonlabored ventilation, respiratory function stable and patient connected to nasal cannula oxygen Cardiovascular status: blood pressure returned to baseline and stable Postop Assessment: no apparent nausea or vomiting Anesthetic complications: no     Last Vitals:  Vitals:   09/05/17 1356 09/05/17 1448  BP: (!) 156/98 (!) 155/60  Pulse: (!) 59 (!) 59  Resp: 18 18  Temp: 36.4 C   SpO2: 98% 98%    Last Pain:  Vitals:   09/05/17 1448  TempSrc:   PainSc: 0-No pain                 Lenard Simmer

## 2017-09-05 NOTE — Interval H&P Note (Signed)
History and Physical Interval Note:  09/05/2017 12:00 PM  Lacey Lawrence  has presented today for surgery, with the diagnosis of ERI BATTERY  The various methods of treatment have been discussed with the patient and family. After consideration of risks, benefits and other options for treatment, the patient has consented to  Procedure(s): ICD GENERATOR CHANGE (N/A) as a surgical intervention .  The patient's history has been reviewed, patient examined, no change in status, stable for surgery.  I have reviewed the patient's chart and labs.  Questions were answered to the patient's satisfaction.     Simmone [facility] [employer]

## 2017-09-05 NOTE — Discharge Instructions (Signed)
AMBULATORY SURGERY  DISCHARGE INSTRUCTIONS   1) The drugs that you were given will stay in your system until tomorrow so for the next 24 hours you should not:  A) Drive an automobile B) Make any legal decisions C) Drink any alcoholic beverage   2) You may resume regular meals tomorrow.  Today it is better to start with liquids and gradually work up to solid foods.  You may eat anything you prefer, but it is better to start with liquids, then soup and crackers, and gradually work up to solid foods.   3) Please notify your doctor immediately if you have any unusual bleeding, trouble breathing, redness and pain at the surgery site, drainage, fever, or pain not relieved by medication.    4) Additional Instructions:        Please contact your physician with any problems or Same Day Surgery at [phone], Monday through Friday 6 am to 4 pm, or [place] at [facility] number at [phone]. Pacemaker Battery Change A pacemaker battery usually lasts 5-15 years (6-7 years on average). A few times a year, you will be asked to visit your health care provider to have a full evaluation of your pacemaker. When the battery is low, your pacemaker battery and generator will be completely replaced. Most often, this procedure is simpler than the first surgery because the wires (leads) that connect the generator to the heart are already in place. There are many things that affect how long a pacemaker battery will last, including:  The age of the pacemaker.  The number of leads you have(1, 2, or 3).  The pacemaker workload. If the pacemaker is helping the heart more often, the battery will not last as long.  Power (voltage) settings.  Tell a health care provider about:  Any allergies you have.  All medicines you are taking, including vitamins, herbs, eye drops, creams, and over-the-counter medicines.  Any problems you or family members have had with anesthetic medicines.  Any  blood disorders you have.  Any surgeries you have had, especially the surgeries you have had since your last pacemaker was placed.  Any medical conditions you have.  Whether you are pregnant or may be pregnant.  Any symptoms of heart problems, such as chest pain, trouble breathing, palpitations, light-headedness, or feelings of an abnormal or irregular heartbeat.  Smoking habits. This can affect your reaction to anesthesia. What are the risks? Generally, this is a safe procedure. However, problems may occur, including:  Bleeding.  Bruising of the skin around where the surgical cut (incision) was made.  Pulling apart of the skin at the incision site.  Infection.  Nerve damage.  Injury to other organs, such as the lungs.  Allergic reaction to anesthetics or other medicines used during the procedure.  People with diabetes may have a temporary increase in blood sugar (glucose) after any surgical procedure.  What happens before the procedure? Staying hydrated Follow instructions from your health care provider about hydration, which may include:  Up to 2 hours before the procedure - you may continue to drink clear liquids, such as water, clear fruit juice, black coffee, and plain tea.  Eating and drinking restrictions Follow instructions from your health care provider about eating and drinking restrictions, which may include:  8 hours before the procedure - stop eating heavy meals or foods such as meat, fried foods, or fatty foods.  6 hours before the procedure - stop eating light meals or foods, such as toast or cereal.  6 hours before the procedure - stop drinking milk or drinks that contain milk.  2 hours before the procedure - stop drinking clear liquids.  General instructions  Ask your health care provider about: ? Changing or stopping your regular medicines. This is especially important if you are taking diabetes medicines or blood thinners. ? Taking medicines such  as aspirin and ibuprofen. These medicines can thin your blood. Do not take these medicines before your procedure if your health care provider instructs you not to. ? Taking a sip of water with any approved medicines on the morning of the procedure.  Plan to have someone take you home after the procedure. What happens during the procedure?  To reduce your risk of infection: ? Your health care team will wash or sanitize their hands. ? The skin around the area of the chest will be washed with soap. ? Hair may be removed from the surgical area.  An IV tube will be inserted into one your veins to give you medicine and fluids.  You will be given one or more of the following: ? A medicine to help you relax (sedative). ? A medicine to numb the area where the pacemaker is located (local anesthetic).  You may be given antibiotic medicine to prevent infection.  Your health care provider will make an incision to reopen the pocket holding the pacemaker.  The old pacemaker will be disconnected from the leads.  The leads will be tested.  If needed, the leads will be replaced. If the leads are functioning properly, the new pacemaker will be connected to the existing leads.  A heart monitor and the pacemaker programmer will be used to make sure that the newly implanted pacemaker is working properly.  The incision site will be closed. A bandage (dressing) will be placed over the pacemaker site. The procedure may vary among health care providers and hospitals. What happens after the procedure?  Your blood pressure, heart rate, breathing rate, and blood oxygen level will be monitored until your health care team is satisfied that your pacemaker is working properly.  Your health care provider will tell you when your pacemaker will need to be tested again, or when to return to the office for removal of dressing and stitches.  Do not drive for 24 hours if you were given a sedative.  The dressing will  be removed 24-48 hours after the procedure, or as told by your health care provider. Summary  A pacemaker battery usually lasts 5-15 years (6-7 years on average).  When the battery is low, your pacemaker battery and generator will be completely replaced.  Risks of this procedure include bleeding, bruising, infection, damage to other structures, pulling apart of the skin at the incision site, and allergic reactions to medicines or anesthetics.  Most often, this procedure is simpler than the first surgery because the wires (leads) that connect the generator to the heart are already in place. This information is not intended to replace advice given to you by your health care provider. Make sure you discuss any questions you have with your health care provider. Document Released: 11/29/2006 Document Revised: 07/25/2016 Document Reviewed: 07/25/2016 Elsevier Interactive Patient Education  2017 [employer]. Patient may shower 09/06/2017.  Leave bandage on.  Restart warfarin 09/06/2017.

## 2017-09-05 NOTE — Op Note (Signed)
[facility]   09/05/2017                     1:13 PM  PATIENT:  Lacey Lawrence    PRE-OPERATIVE DIAGNOSIS:  ERI BATTERY  POST-OPERATIVE DIAGNOSIS:  Same  PROCEDURE:  ICD GENERATOR CHANGE  SURGEON:  Marcina Millard, MD    ANESTHESIA:     PREOPERATIVE INDICATIONS:  Lacey Lawrence is a  72 y.o. female with a diagnosis of ERI BATTERY who failed conservative measures and elected for surgical management.    The risks benefits and alternatives were discussed with the patient preoperatively including but not limited to the risks of infection, bleeding, cardiopulmonary complications, the need for revision surgery, among others, and the patient was willing to proceed.   OPERATIVE PROCEDURE: The patient was brought to the operating room fasting state.  The left pectoral region was prepped and draped in usual sterile manner.  Anesthesia was obtained 1% lidocaine locally.  A 6 cm incision was performed the left pectoral region.  The previous ICD generator was retrieved by electrocautery and blunt dissection.  The leads were disconnected, and connected to a new dual-chamber MRI compatible ICD generator ( [id number] [id number] ).  The new ICD generator was positioned into the pocket the ICD pocket was irrigated with gentamicin solution.  The pocket was closed with 2-0 and 4-0 Vicryl, respectively.  Postprocedural interrogation revealed appropriate atrial and ventricular sensing and pacing thresholds.  There were no periprocedural complications.     ICD Criteria  Current LVEF:55%. Within 12 months prior to implant: No   Heart failure history: Yes, Class II  Cardiomyopathy history: Yes, Non-Ischemic Cardiomyopathy.  Atrial Fibrillation/Atrial Flutter: Yes, Paroxysmal.  Ventricular tachycardia history: No.  Cardiac arrest history: No.  History of syndromes with risk of sudden death:  No.  Previous ICD: Yes, Reason for ICD:  Primary prevention.  Current ICD indication: Primary  PPM indication: Yes. Pacing type: Both. Greater than 40% RV pacing requirement anticipated. Indication: Sick Sinus Syndrome   Class I or II Bradycardia indication present: Yes  Beta Blocker therapy for 3 or more months: Yes, prescribed.   Ace Inhibitor/ARB therapy for 3 or more months: Yes, prescribed.

## 2017-09-11 ENCOUNTER — Encounter: Payer: Self-pay | Admitting: Cardiology

## 2018-06-03 ENCOUNTER — Telehealth: Payer: Self-pay | Admitting: Cardiology

## 2018-06-03 NOTE — Telephone Encounter (Signed)
LMOVM for pt regarding releasing her information in carelink to [facility].

## 2018-06-19 NOTE — Telephone Encounter (Signed)
Released pt to [facility] Pt confirmed the ok.

## 2018-08-13 IMAGING — CR DG CHEST 2V
1 series · 2 of 2 positions shown · non-contrast
Comparison: 10/25/2015

CLINICAL DATA: Preop for pacemaker battery change.

EXAM:
CHEST  2 VIEW

[Series 1: w chest pa · 0.14mm/px · 2 of 2 slices shown]
[im 1/2]
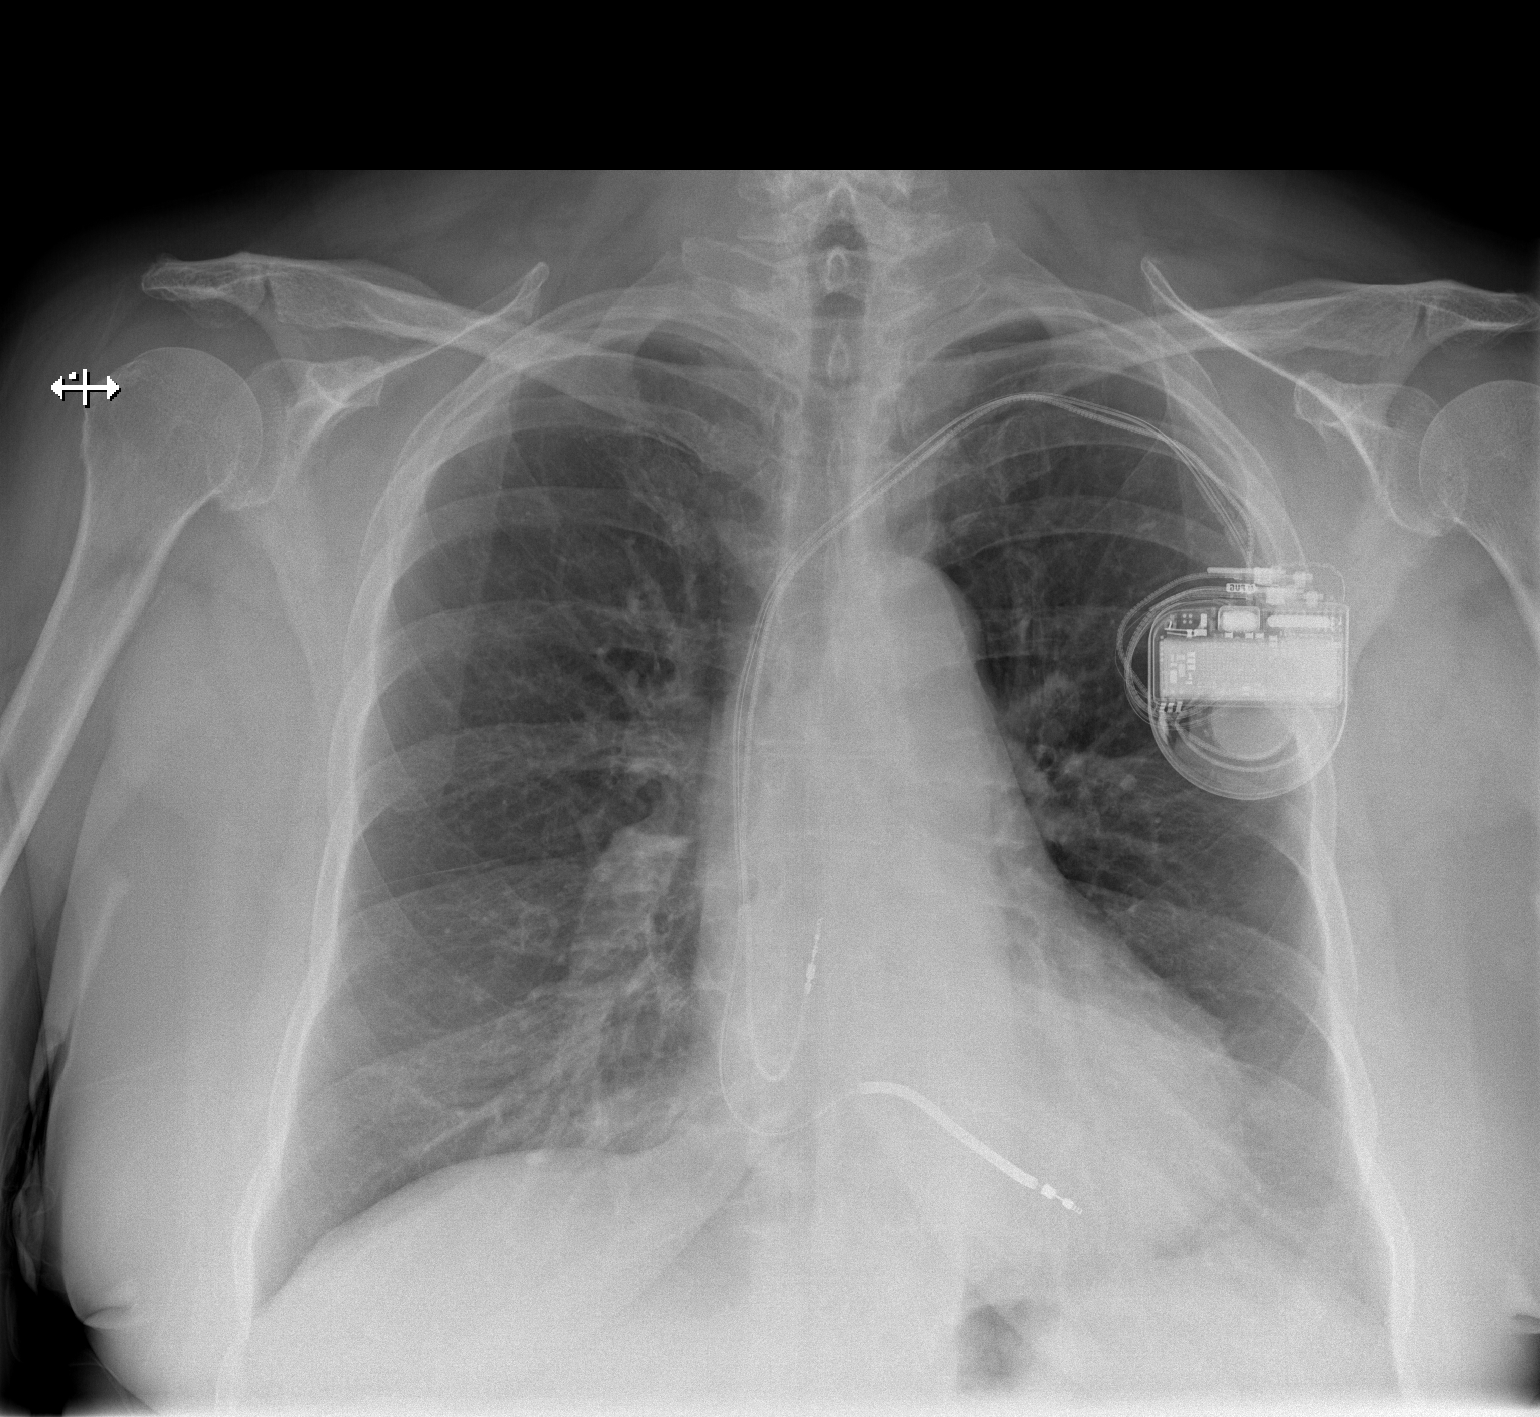
[im 2/2]
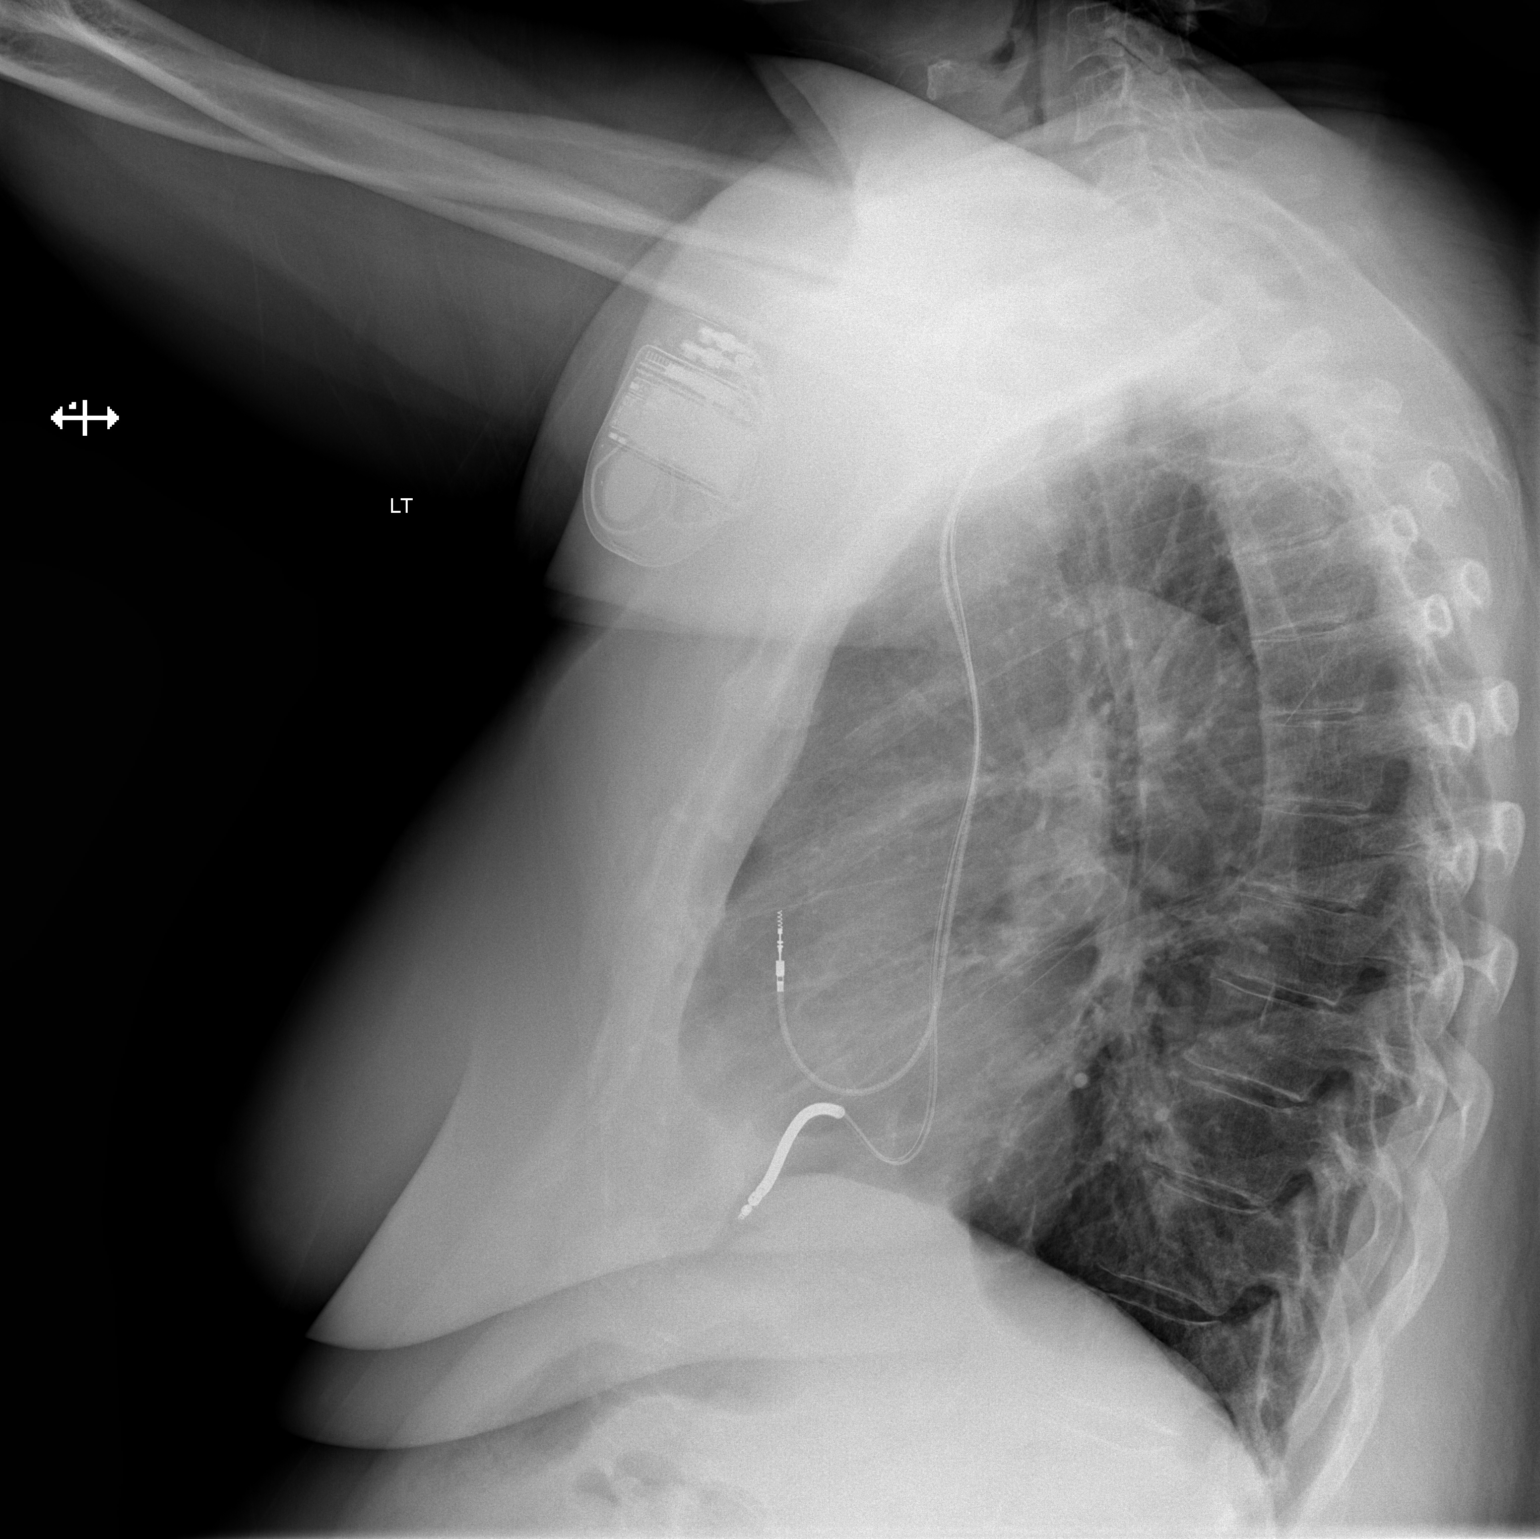

[2 of 2 positions shown; findings below may reference images not displayed]

FINDINGS: The pacer wires are stable. The cardiac silhouette, mediastinal and
hilar contours are within normal limits and stable. The lungs are
clear. The bony thorax is intact.
IMPRESSION: No acute cardiopulmonary findings.

## 2018-10-31 ENCOUNTER — Other Ambulatory Visit: Payer: Self-pay | Admitting: Family Medicine

## 2018-10-31 DIAGNOSIS — N950 Postmenopausal bleeding: Secondary | ICD-10-CM

## 2018-11-04 ENCOUNTER — Ambulatory Visit
Admission: RE | Admit: 2018-11-04 | Discharge: 2018-11-04 | Disposition: A | Discharge status: Home / Self Care | Payer: Medicare Other | Source: Ambulatory Visit | Attending: Family Medicine | Admitting: Family Medicine

## 2018-11-04 ENCOUNTER — Other Ambulatory Visit: Payer: Self-pay

## 2018-11-04 ENCOUNTER — Other Ambulatory Visit: Payer: Self-pay | Admitting: Family Medicine

## 2018-11-04 DIAGNOSIS — N950 Postmenopausal bleeding: Secondary | ICD-10-CM

## 2019-10-12 IMAGING — US US PELVIS COMPLETE
1 series · 13 of 25 positions shown · non-contrast
Comparison: None

CLINICAL DATA: Postmenopausal bleeding, patient on warfarin

EXAM:
TRANSABDOMINAL ULTRASOUND OF PELVIS
TECHNIQUE: Transabdominal ultrasound examination of the pelvis was performed
including evaluation of the uterus, ovaries, adnexal regions, and
pelvic cul-de-sac. Patient declined transvaginal imaging.
Transabdominal imaging is limited due to body habitus and a poor
acoustic window due to inadequate bladder distention.

[Series 1: us pelvis complete · 13 of 35 slices shown]
[im 1/35]
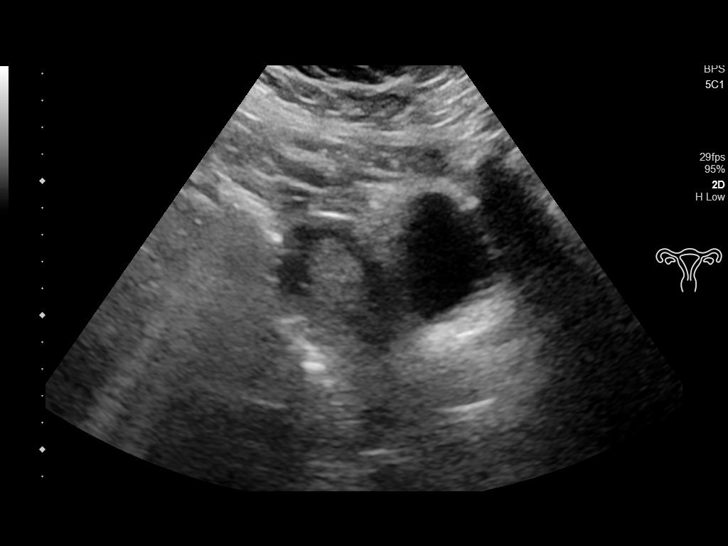
[im 3/35]
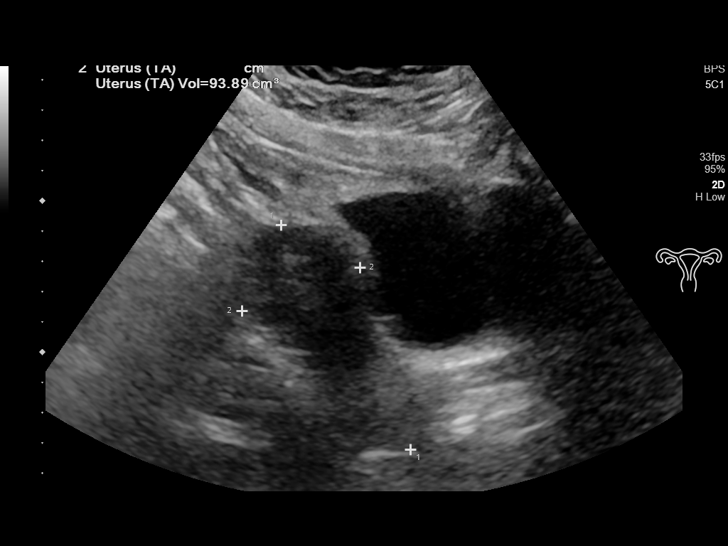
[im 6/35]
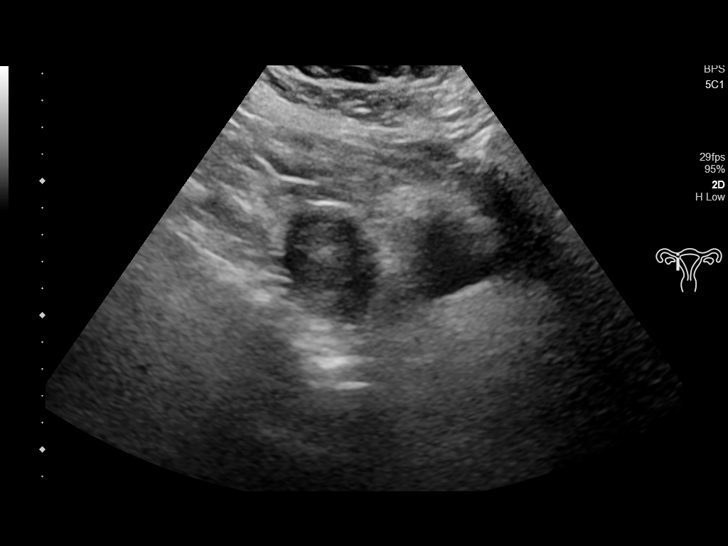
[im 9/35]
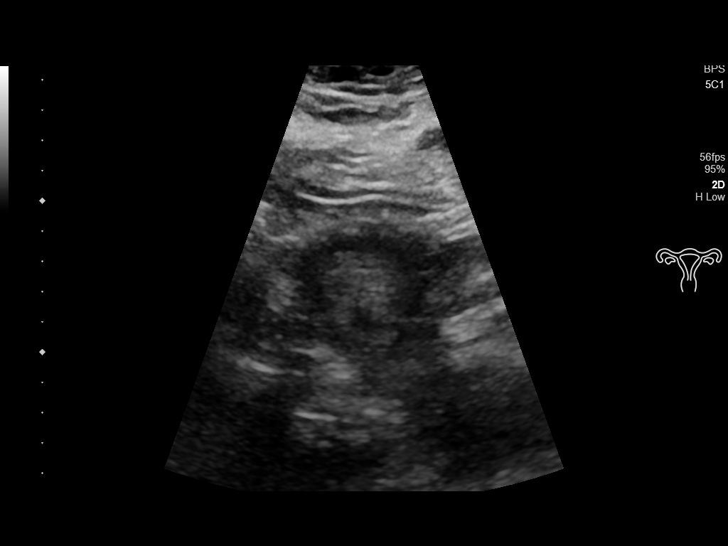
[im 12/35]
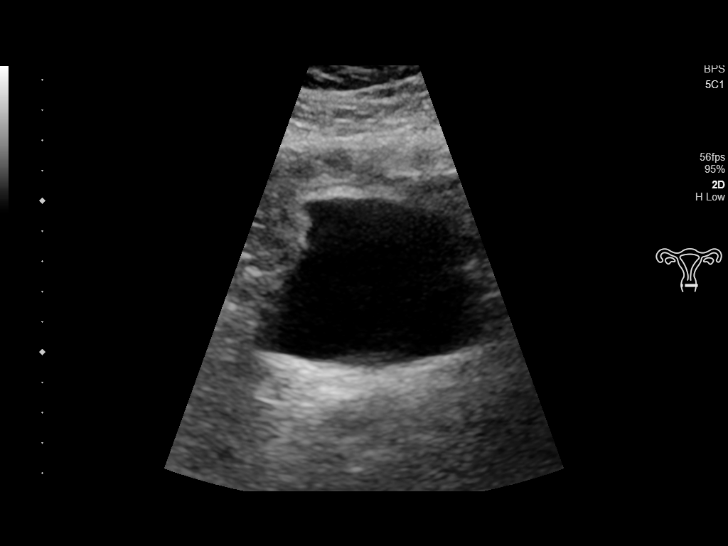
[im 15/35]
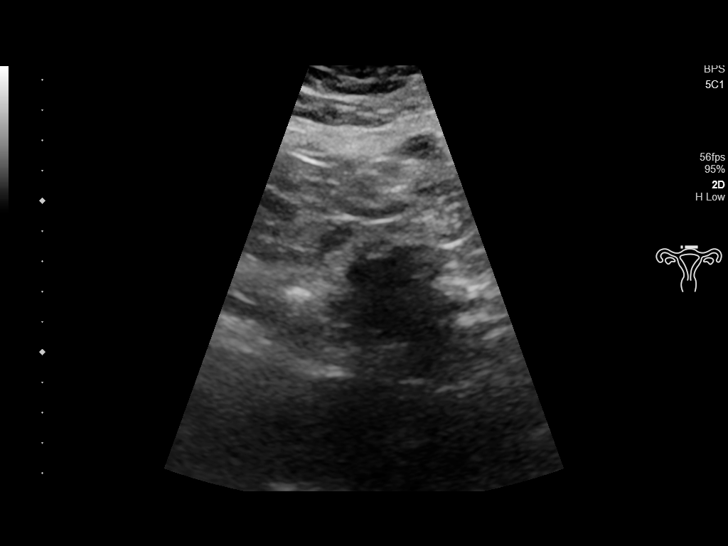
[im 18/35]
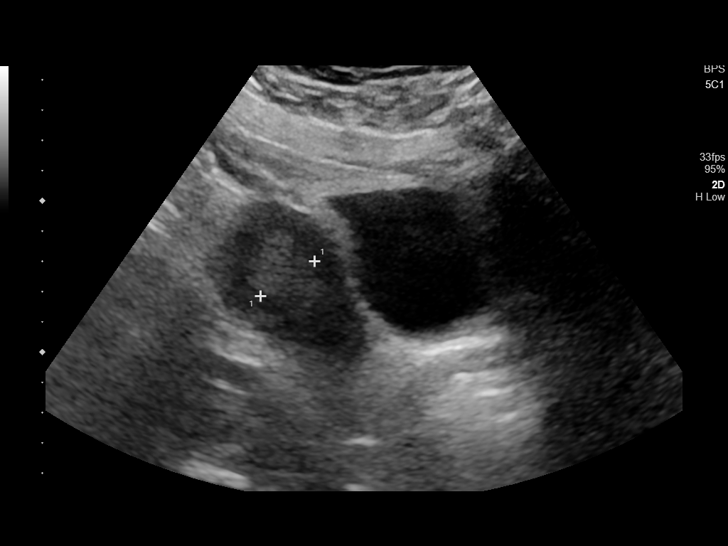
[im 20/35]
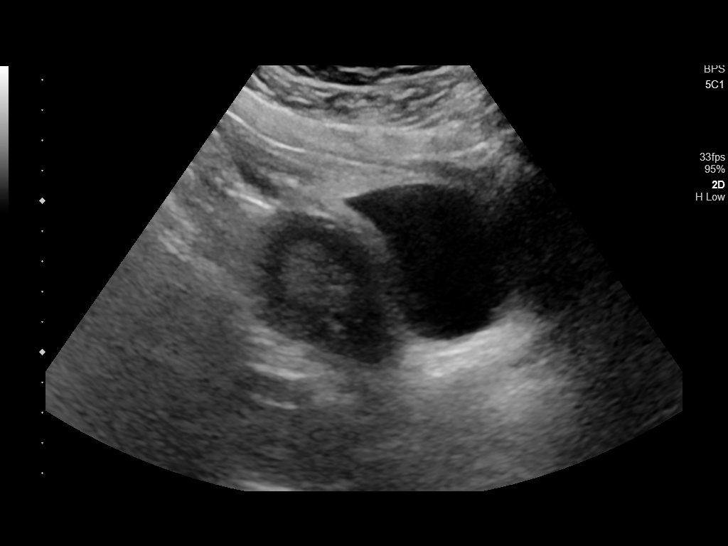
[im 23/35]
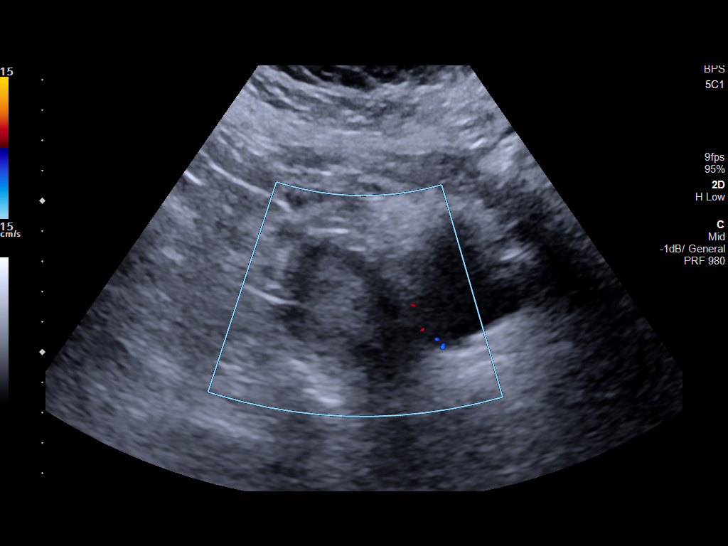
[im 26/35]
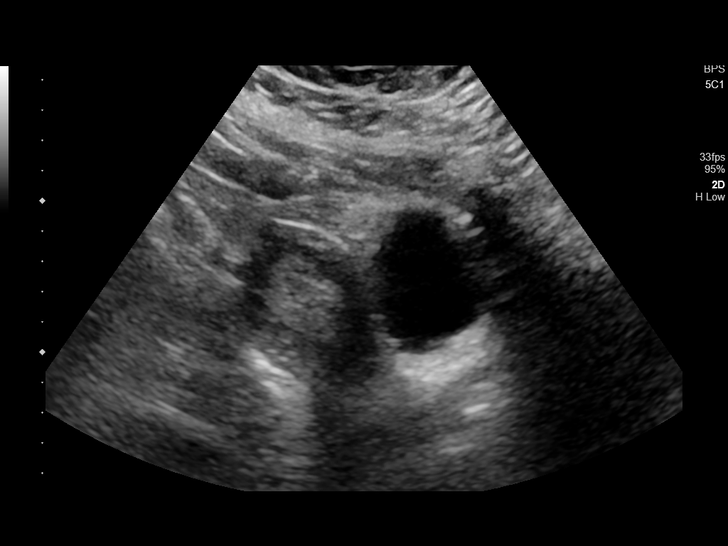
[im 29/35]
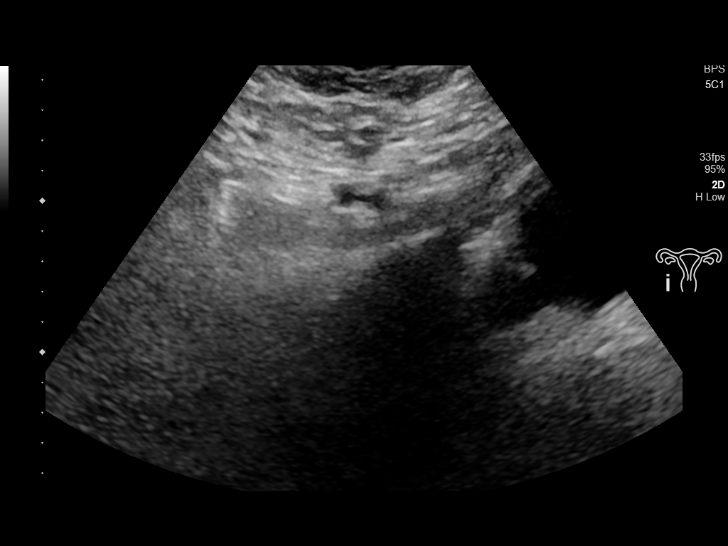
[im 32/35]
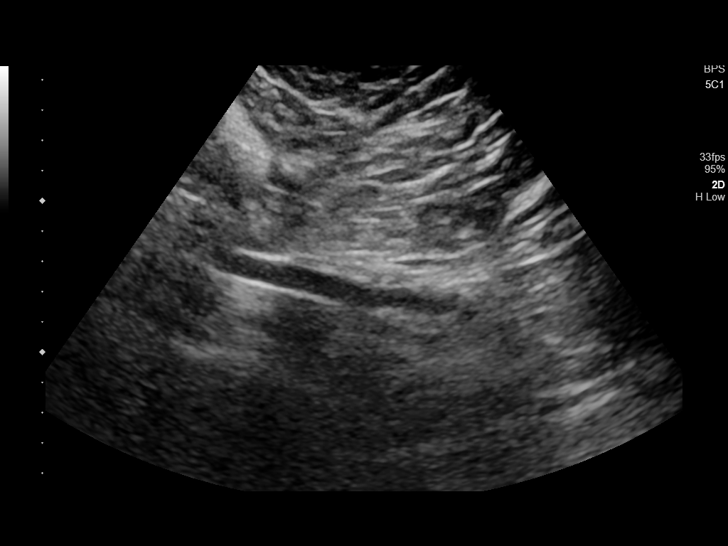
[im 35/35]
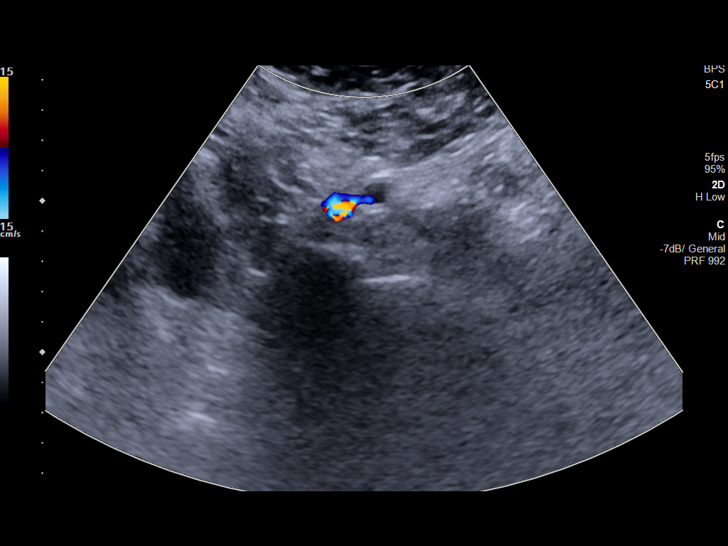

[13 of 25 positions shown; findings below may reference images not displayed]

FINDINGS: Uterus

Measurements: 8.6 x 4.2 x 5.0 cm = volume: 97 mL. Normal morphology
without mass

Endometrium

Thickness: 23 mm, abnormally thickened. No endometrial fluid or
obvious focal lesion by transabdominal imaging

Right ovary

Not visualized on either transabdominal or endovaginal imaging,
likely obscured by bowel

Left ovary

Not visualized on either transabdominal or endovaginal imaging,
likely obscured by bowel

Other findings:  No free pelvic fluid.  No adnexal masses.
IMPRESSION: Limited exam due to body habitus, inadequate bladder distention on
transabdominal imaging, and patient Chevere Ortega declined transvaginal
imaging.

Abnormal thickened endometrial complex 23 mm thick.

In the setting of post-menopausal bleeding, endometrial sampling is
indicated to exclude carcinoma. If results are benign,
sonohysterogram should be considered for focal lesion work-up. (Ref:
Radiological Reasoning: Algorithmic Workup of Abnormal Vaginal
Bleeding with Endovaginal Sonography and Sonohysterography. AJR
[id number]; [id number])
